# Patient Record
Sex: Female | Born: 1980
Health system: Southern US, Community
[De-identification: ages and names within clinical notes are randomized; demographics above are authoritative.]

## PROBLEM LIST (undated history)

## (undated) ENCOUNTER — Inpatient Hospital Stay (HOSPITAL_COMMUNITY): Payer: Self-pay

## (undated) ENCOUNTER — Emergency Department (HOSPITAL_COMMUNITY): Admission: EM | Payer: 59 | Source: Home / Self Care

## (undated) DIAGNOSIS — D649 Anemia, unspecified: Secondary | ICD-10-CM

## (undated) DIAGNOSIS — G5602 Carpal tunnel syndrome, left upper limb: Secondary | ICD-10-CM

## (undated) DIAGNOSIS — M6740 Ganglion, unspecified site: Secondary | ICD-10-CM

## (undated) DIAGNOSIS — A6000 Herpesviral infection of urogenital system, unspecified: Secondary | ICD-10-CM

## (undated) HISTORY — PX: WISDOM TOOTH EXTRACTION: SHX21

## (undated) HISTORY — DX: Anemia, unspecified: D64.9

---
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
## 2002-02-16 ENCOUNTER — Inpatient Hospital Stay (HOSPITAL_COMMUNITY): Admission: AD | Admit: 2002-02-16 | Discharge: 2002-02-16 | Payer: Self-pay | Admitting: Gynecology

## 2002-03-14 ENCOUNTER — Inpatient Hospital Stay (HOSPITAL_COMMUNITY): Admission: AD | Admit: 2002-03-14 | Discharge: 2002-03-14 | Payer: Self-pay | Admitting: Gynecology

## 2002-04-14 ENCOUNTER — Observation Stay (HOSPITAL_COMMUNITY): Admission: AD | Admit: 2002-04-14 | Discharge: 2002-04-15 | Payer: Self-pay | Admitting: *Deleted

## 2004-07-12 ENCOUNTER — Ambulatory Visit (HOSPITAL_COMMUNITY): Admission: RE | Admit: 2004-07-12 | Discharge: 2004-07-12 | Payer: Self-pay | Admitting: Family Medicine

## 2005-07-02 ENCOUNTER — Inpatient Hospital Stay (HOSPITAL_COMMUNITY): Admission: AD | Admit: 2005-07-02 | Discharge: 2005-07-02 | Payer: Self-pay | Admitting: Gynecology

## 2005-07-04 ENCOUNTER — Other Ambulatory Visit: Admission: RE | Admit: 2005-07-04 | Discharge: 2005-07-04 | Payer: Self-pay | Admitting: Gynecology

## 2005-08-18 ENCOUNTER — Inpatient Hospital Stay (HOSPITAL_COMMUNITY): Admission: AD | Admit: 2005-08-18 | Discharge: 2005-08-18 | Payer: Self-pay | Admitting: Gynecology

## 2005-12-25 ENCOUNTER — Inpatient Hospital Stay (HOSPITAL_COMMUNITY): Admission: AD | Admit: 2005-12-25 | Discharge: 2005-12-28 | Payer: Self-pay | Admitting: Gynecology

## 2006-02-11 ENCOUNTER — Other Ambulatory Visit: Admission: RE | Admit: 2006-02-11 | Discharge: 2006-02-11 | Payer: Self-pay | Admitting: Gynecology

## 2012-01-26 ENCOUNTER — Ambulatory Visit (INDEPENDENT_AMBULATORY_CARE_PROVIDER_SITE_OTHER): Payer: 59 | Admitting: Family Medicine

## 2012-01-26 VITALS — BP 113/76 | HR 62 | Temp 98.3°F | Resp 16 | Ht 68.0 in | Wt 292.0 lb

## 2012-01-26 DIAGNOSIS — J310 Chronic rhinitis: Secondary | ICD-10-CM

## 2012-01-26 DIAGNOSIS — J069 Acute upper respiratory infection, unspecified: Secondary | ICD-10-CM

## 2012-01-26 MED ORDER — FLUTICASONE PROPIONATE 50 MCG/ACT NA SUSP: 2.0000 | Freq: Every day | NASAL | Status: DC

## 2012-01-26 MED ORDER — HYDROCODONE-HOMATROPINE 5-1.5 MG/5ML PO SYRP: 5.0000 mL | ORAL_SOLUTION | Freq: Three times a day (TID) | ORAL | Status: AC | PRN

## 2012-01-26 MED ORDER — BENZONATATE 100 MG PO CAPS: 200.0000 mg | ORAL_CAPSULE | Freq: Two times a day (BID) | ORAL | Status: AC | PRN

## 2012-01-26 NOTE — Progress Notes (Signed)
  [facility]:  Office Visit  Chief Complaint:  Chief Complaint  Patient presents with  . Cough    HPI: Heather [name] is a 31 y.o. female who complains of  1 week h/o cough, white phlegm, worse at night, no fever, + chills. No msk aches or pain. + sore throat. Tried theraflu, hot drinks, tylenol cold and flu, D[place], tea with honey without relief  History reviewed. No pertinent past medical history. History reviewed. No pertinent past surgical history. History   Social History  . Marital Status: Single    Spouse Name: N/A    Number of Children: N/A  . Years of Education: N/A   Social History Main Topics  . Smoking status: Never Smoker   . Smokeless tobacco: None  . Alcohol Use: No  . Drug Use: No  . Sexually Active: None   Other Topics Concern  . None   Social History Narrative  . None   No family history on file. No Known Allergies Prior to Admission medications   Medication Sig Start Date End Date Taking? Authorizing Provider  fluconazole (DIFLUCAN) 150 MG tablet Take 150 mg by mouth.   Yes Historical Provider, MD  valACYclovir (VALTREX) 500 MG tablet Take 500 mg by mouth as needed.   Yes Historical Provider, MD     ROS: The patient denies fevers, night sweats, unintentional weight loss, chest pain, palpitations, wheezing, dyspnea on exertion, nausea, vomiting, abdominal pain, dysuria, hematuria, melena, numbness, weakness, or tingling. + cough  All other systems have been reviewed and were otherwise negative with the exception of those mentioned in the HPI and as above.    PHYSICAL EXAM: Filed Vitals:   01/26/12 1933  BP: 113/76  Pulse: 62  Temp: 98.3 F (36.8 C)  Resp: 16   Filed Vitals:   01/26/12 1933  Height: 5\' 8"  (1.727 m)  Weight: 292 lb (132.45 kg)   Body mass index is 44.40 kg/(m^2).  General: Alert, no acute distress, obese HEENT:  Normocephalic, atraumatic, oropharynx patent. TM nl, no sinus tenderness, no exudates,  + boggy nasal passages Cardiovascular:  Regular rate and rhythm, no rubs murmurs or gallops.  No Carotid bruits, radial pulse intact. No pedal edema.  Respiratory: Clear to auscultation bilaterally.  No wheezes, rales, or rhonchi.  No cyanosis, no use of accessory musculature GI: No organomegaly, abdomen is soft and non-tender, positive bowel sounds.  No masses. Skin: No rashes. Neurologic: Facial musculature symmetric. Psychiatric: Patient is appropriate throughout our interaction. Lymphatic: No cervical lymphadenopathy Musculoskeletal: Gait intact.   LABS: No results found for this or any previous visit.   EKG/XRAY:   Primary read interpreted by Dr. Conley Rolls at [facility].   ASSESSMENT/PLAN: Encounter Diagnoses  Name Primary?  . Viral URI with cough Yes  . Rhinitis    Sxs treatment only with flonase, hydromet, tessalon perles    LE, THAO PHUONG, DO 01/30/2012 9:19 AM

## 2012-03-13 ENCOUNTER — Emergency Department (HOSPITAL_BASED_OUTPATIENT_CLINIC_OR_DEPARTMENT_OTHER)
Admission: EM | Admit: 2012-03-13 | Discharge: 2012-03-14 | Disposition: A | Discharge status: Home / Self Care | Payer: 59 | Attending: Emergency Medicine | Admitting: Emergency Medicine

## 2012-03-13 ENCOUNTER — Encounter (HOSPITAL_BASED_OUTPATIENT_CLINIC_OR_DEPARTMENT_OTHER): Payer: Self-pay | Admitting: *Deleted

## 2012-03-13 DIAGNOSIS — M62838 Other muscle spasm: Secondary | ICD-10-CM

## 2012-03-13 DIAGNOSIS — M542 Cervicalgia: Secondary | ICD-10-CM | POA: Insufficient documentation

## 2012-03-13 DIAGNOSIS — G5600 Carpal tunnel syndrome, unspecified upper limb: Secondary | ICD-10-CM | POA: Insufficient documentation

## 2012-03-13 HISTORY — DX: Herpesviral infection of urogenital system, unspecified: A60.00

## 2012-03-13 NOTE — ED Notes (Signed)
Pt states that she began to have neck pain that radiates down left arm as well as some numbness and tingling in her hand

## 2012-03-14 MED ORDER — IBUPROFEN 800 MG PO TABS: 800.0000 mg | ORAL_TABLET | Freq: Three times a day (TID) | ORAL | Status: AC

## 2012-03-14 NOTE — Discharge Instructions (Signed)
Carpal Tunnel Syndrome The carpal tunnel is a narrow hollow area in the wrist. It is formed by the wrist bones and ligaments. Nerves, blood vessels, and tendons (cord like structures which attach muscle to bone) on the palm side (the side of your hand in the direction your fingers bend) of your hand pass through the carpal tunnel. Repeated wrist motion or certain diseases may cause swelling within the tunnel. (That is why these are called repetitive trauma (damage caused by over use) disorders. It is also a common problem in late pregnancy.) This swelling pinches the main nerve in the wrist (median nerve) and causes the painful condition called carpal tunnel syndrome. A feeling of "pins and needles" may be noticed in the fingers or hand; however, the entire arm may ache from this condition. Carpal tunnel syndrome may clear up by itself. Cortisone injections may help. Sometimes, an operation may be needed to free the pinched nerve. An electromyogram (a type of test) may be needed to confirm this diagnosis (learning what is wrong). This is a test which measures nerve conduction. The nerve conduction is usually slowed in a carpal tunnel syndrome. HOME CARE INSTRUCTIONS   If your caregiver prescribed medication to help reduce swelling, take as directed.   If you were given a splint to keep your wrist from bending, use it as instructed. It is important to wear the splint at night. Use the splint for as long as you have pain or numbness in your hand, arm or wrist. This may take 1 to 2 months.   If you have pain at night, it may help to rub or shake your hand, or elevate your hand above the level of your heart (the center of your chest).   It is important to give your wrist a rest by stopping the activities that are causing the problem. If your symptoms (problems) are work-related, you may need to talk to your employer about changing to a job that does not require using your wrist.   Only take over-the-counter  or prescription medicines for pain, discomfort, or fever as directed by your caregiver.   Following periods of extended use, particularly strenuous use, apply an ice pack wrapped in a towel to the anterior (palm) side of the affected wrist for 20 to 30 minutes. Repeat as needed three to four times per day. This will help reduce the swelling.   Follow all instructions for follow-up with your caregiver. This includes any orthopedic referrals, physical therapy, and rehabilitation. Any delay in obtaining necessary care could result in a delay or failure of your condition to heal.  SEEK IMMEDIATE MEDICAL CARE IF:   You are still having pain and numbness following a week of treatment.   You develop new, unexplained symptoms.   Your current symptoms are getting worse and are not helped or controlled with medications.  MAKE SURE YOU:   Understand these instructions.   Will watch your condition.   Will get help right away if you are not doing well or get worse.  Document Released: 10/24/2000 Document Revised: 10/16/2011 Document Reviewed: 09/12/2011 [employer] Patient Information 2012 [employer]. 

## 2012-03-14 NOTE — ED Provider Notes (Signed)
History   This chart was scribed for Gillian Kluever Smitty Cords, MD scribed by Magnus Sinning. The patient was seen in room MH05/MH05 seen at 00:13    CSN: [id number]  Arrival date & time 03/13/12  2311   First MD Initiated Contact with Patient 03/14/12 0012      Chief Complaint  Patient presents with  . Neck Pain  . Shoulder Pain    (Consider location/radiation/quality/duration/timing/severity/associated sxs/prior treatment) The history is provided by the patient. No language interpreter was used.   Heather [name] is a 31 y.o. female who presents to the Emergency Department complaining of constant moderate left lateral neck pain to left shoulder pain and down arm> she has had intermittent paresthesia of the first 2.5 fingers of the left hand especially upon wakening or after typing on her keyboard at work.   Explains she was lying down earlier this evening when she began having sxs. Reports that she sleeps on her left side. Denies any recent heavy lifting or new workout.  No f/c/r. No swelling of the arm. No redness or rashes.  Pain in the shoulder worse with movement in certain directions. Spasmodic in nature.  No CP, no SOB.  No weakness.  No f/c/r.  No trauma.   Past Medical History  Diagnosis Date  . Herpes genitalia     History reviewed. No pertinent past surgical history.  History reviewed. No pertinent family history.  History  Substance Use Topics  . Smoking status: Never Smoker   . Smokeless tobacco: Not on file  . Alcohol Use: No   Review of Systems  Constitutional: Negative for fever.  HENT: Negative for neck stiffness.   Respiratory: Negative for shortness of breath.   Cardiovascular: Negative for chest pain, palpitations and leg swelling.  Neurological: Negative for weakness.  All other systems reviewed and are negative.   10 Systems reviewed and are negative for acute change except as noted in the HPI. Allergies  Review of patient's allergies indicates no  known allergies.  Home Medications   Current Outpatient Rx  Name Route Sig Dispense Refill  . ECHINACEA PO CAPS Oral Take 1 capsule by mouth daily.    Marland Kitchen FLUCONAZOLE 150 MG PO TABS Oral Take 150 mg by mouth every 30 (thirty) days.     . ADULT MULTIVITAMIN W/MINERALS CH Oral Take 1 tablet by mouth daily.    Marland Kitchen VALACYCLOVIR HCL 500 MG PO TABS Oral Take 500 mg by mouth 2 (two) times daily as needed. For outbreak      BP 165/76  Pulse 102  Temp(Src) 98.4 F (36.9 C) (Oral)  Resp 16  SpO2 100%  LMP 03/06/2012  Physical Exam  Nursing note and vitals reviewed. Constitutional: She is oriented to person, place, and time. She appears well-developed and well-nourished. No distress.  HENT:  Head: Normocephalic and atraumatic.  Eyes: EOM are normal. Pupils are equal, round, and reactive to light.  Neck: Neck supple. No tracheal deviation present. No thyromegaly present.       No lymphadenopathy of the anterior nor posterior cervical chain.  No supraclavicular or axillary lymphadenopathy Trachea midline  Cardiovascular: Normal rate and regular rhythm.   Pulmonary/Chest: Effort normal and breath sounds normal. No respiratory distress. She has no wheezes. She has no rales. She exhibits no tenderness.  Abdominal: Soft. Bowel sounds are normal. She exhibits no distension. There is no tenderness. There is no rebound and no guarding.  Musculoskeletal: Normal range of motion. She exhibits no edema.  Left shoulder: She exhibits spasm.       Arms:      Normal radial pulses B.   5/5 strength in BUE, intact  reflexes  Neurological: She is alert and oriented to person, place, and time. She has normal strength. No sensory deficit.  Skin: Skin is warm and dry.       Cap refill < 2 seconds  Psychiatric: She has a normal mood and affect. Her behavior is normal.    ED Course  Procedures (including critical care time) DIAGNOSTIC STUDIES: Oxygen Saturation is 100% on room air, normal by my  interpretation.    COORDINATION OF CARE:  Labs Reviewed - No data to display No results found.   No diagnosis found.    MDM  Carpal tunnel of the left hand, splint applied.  With trapezius spasm.  Do not sleep on left side.  Posture and improved ergonomic discussed with patient.  NSAIDs and close follow up.  Patient verbalizes understanding and agrees to follow up    I personally performed the services described in this documentation, which was scribed in my presence. The recorded information has been reviewed and considered.     Siara Gorder Smitty Cords, MD 03/14/12 0300

## 2012-06-10 DIAGNOSIS — G5602 Carpal tunnel syndrome, left upper limb: Secondary | ICD-10-CM

## 2012-06-10 DIAGNOSIS — M6740 Ganglion, unspecified site: Secondary | ICD-10-CM

## 2012-06-10 HISTORY — DX: Ganglion, unspecified site: M67.40

## 2012-06-10 HISTORY — DX: Carpal tunnel syndrome, left upper limb: G56.02

## 2012-06-11 ENCOUNTER — Other Ambulatory Visit: Payer: Self-pay | Admitting: Orthopedic Surgery

## 2012-06-21 ENCOUNTER — Encounter (HOSPITAL_BASED_OUTPATIENT_CLINIC_OR_DEPARTMENT_OTHER): Payer: Self-pay | Admitting: *Deleted

## 2012-06-28 ENCOUNTER — Encounter (HOSPITAL_BASED_OUTPATIENT_CLINIC_OR_DEPARTMENT_OTHER): Payer: Self-pay | Admitting: *Deleted

## 2012-06-28 ENCOUNTER — Encounter (HOSPITAL_BASED_OUTPATIENT_CLINIC_OR_DEPARTMENT_OTHER): Payer: Self-pay | Admitting: Anesthesiology

## 2012-06-28 ENCOUNTER — Encounter (HOSPITAL_BASED_OUTPATIENT_CLINIC_OR_DEPARTMENT_OTHER): Payer: Self-pay | Admitting: Orthopedic Surgery

## 2012-06-28 ENCOUNTER — Encounter (HOSPITAL_BASED_OUTPATIENT_CLINIC_OR_DEPARTMENT_OTHER)
Admission: RE | Disposition: A | Discharge status: Home / Self Care | Payer: Self-pay | Source: Ambulatory Visit | Attending: Orthopedic Surgery

## 2012-06-28 ENCOUNTER — Ambulatory Visit (HOSPITAL_BASED_OUTPATIENT_CLINIC_OR_DEPARTMENT_OTHER): Payer: Worker's Compensation | Admitting: Anesthesiology

## 2012-06-28 ENCOUNTER — Ambulatory Visit (HOSPITAL_BASED_OUTPATIENT_CLINIC_OR_DEPARTMENT_OTHER)
Admission: RE | Admit: 2012-06-28 | Discharge: 2012-06-28 | Disposition: A | Discharge status: Home / Self Care | Payer: Worker's Compensation | Source: Ambulatory Visit | Attending: Orthopedic Surgery | Admitting: Orthopedic Surgery

## 2012-06-28 DIAGNOSIS — M6740 Ganglion, unspecified site: Secondary | ICD-10-CM | POA: Insufficient documentation

## 2012-06-28 DIAGNOSIS — G5600 Carpal tunnel syndrome, unspecified upper limb: Secondary | ICD-10-CM | POA: Insufficient documentation

## 2012-06-28 HISTORY — PX: CARPAL TUNNEL RELEASE: SHX101

## 2012-06-28 HISTORY — DX: Ganglion, unspecified site: M67.40

## 2012-06-28 HISTORY — DX: Carpal tunnel syndrome, left upper limb: G56.02

## 2012-06-28 SURGERY — CARPAL TUNNEL RELEASE
Anesthesia: General | Site: Wrist | Laterality: Left | Wound class: Clean

## 2012-06-28 MED ORDER — FENTANYL CITRATE 0.05 MG/ML IJ SOLN
25.0000 ug | INTRAMUSCULAR | Status: DC | PRN
  Administered 2012-06-28: 25 ug via INTRAVENOUS

## 2012-06-28 MED ORDER — BUPIVACAINE HCL (PF) 0.25 % IJ SOLN
INTRAMUSCULAR | Status: DC | PRN
  Administered 2012-06-28: 10 mL

## 2012-06-28 MED ORDER — OXYCODONE HCL 5 MG/5ML PO SOLN: 5.0000 mg | Freq: Once | ORAL | Status: DC | PRN

## 2012-06-28 MED ORDER — FENTANYL CITRATE 0.05 MG/ML IJ SOLN
INTRAMUSCULAR | Status: DC | PRN
  Administered 2012-06-28 (×3): 50 ug via INTRAVENOUS

## 2012-06-28 MED ORDER — OXYCODONE HCL 5 MG PO TABS: 5.0000 mg | ORAL_TABLET | Freq: Once | ORAL | Status: DC | PRN

## 2012-06-28 MED ORDER — HYDROCODONE-ACETAMINOPHEN 5-325 MG PO TABS: ORAL_TABLET | ORAL | Status: DC

## 2012-06-28 MED ORDER — ONDANSETRON HCL 4 MG/2ML IJ SOLN
INTRAMUSCULAR | Status: DC | PRN
  Administered 2012-06-28: 4 mg via INTRAVENOUS

## 2012-06-28 MED ORDER — LIDOCAINE HCL (CARDIAC) 20 MG/ML IV SOLN
INTRAVENOUS | Status: DC | PRN
  Administered 2012-06-28: 50 mg via INTRAVENOUS

## 2012-06-28 MED ORDER — SCOPOLAMINE 1 MG/3DAYS TD PT72
1.0000 | MEDICATED_PATCH | Freq: Once | TRANSDERMAL | Status: DC
  Administered 2012-06-28: 1.5 mg via TRANSDERMAL

## 2012-06-28 MED ORDER — MIDAZOLAM HCL 5 MG/5ML IJ SOLN
INTRAMUSCULAR | Status: DC | PRN
  Administered 2012-06-28: 2 mg via INTRAVENOUS

## 2012-06-28 MED ORDER — CEFAZOLIN SODIUM-DEXTROSE 2-3 GM-% IV SOLR
2.0000 g | Freq: Once | INTRAVENOUS | Status: AC
  Administered 2012-06-28: 3 g via INTRAVENOUS

## 2012-06-28 MED ORDER — CHLORHEXIDINE GLUCONATE 4 % EX LIQD: 60.0000 mL | Freq: Once | CUTANEOUS | Status: DC

## 2012-06-28 MED ORDER — DEXAMETHASONE SODIUM PHOSPHATE 4 MG/ML IJ SOLN
INTRAMUSCULAR | Status: DC | PRN
  Administered 2012-06-28: 10 mg via INTRAVENOUS

## 2012-06-28 MED ORDER — PROPOFOL 10 MG/ML IV EMUL
INTRAVENOUS | Status: DC | PRN
  Administered 2012-06-28: 50 mg via INTRAVENOUS
  Administered 2012-06-28: 250 mg via INTRAVENOUS

## 2012-06-28 MED ORDER — METOCLOPRAMIDE HCL 5 MG/ML IJ SOLN: 10.0000 mg | Freq: Once | INTRAMUSCULAR | Status: DC | PRN

## 2012-06-28 MED ORDER — LACTATED RINGERS IV SOLN
INTRAVENOUS | Status: DC
  Administered 2012-06-28 (×2): via INTRAVENOUS

## 2012-06-28 SURGICAL SUPPLY — 35 items
BANDAGE ELASTIC 3 VELCRO ST LF (GAUZE/BANDAGES/DRESSINGS) ×2 IMPLANT
BANDAGE GAUZE ELAST BULKY 4 IN (GAUZE/BANDAGES/DRESSINGS) ×2 IMPLANT
BLADE MINI RND TIP GREEN BEAV (BLADE) IMPLANT
BLADE SURG 15 STRL LF DISP TIS (BLADE) ×2 IMPLANT
BLADE SURG 15 STRL SS (BLADE) ×4
BNDG CMPR 9X4 STRL LF SNTH (GAUZE/BANDAGES/DRESSINGS) ×1
BNDG ESMARK 4X9 LF (GAUZE/BANDAGES/DRESSINGS) ×1 IMPLANT
CHLORAPREP W/TINT 26ML (MISCELLANEOUS) ×2 IMPLANT
CLOTH BEACON ORANGE TIMEOUT ST (SAFETY) ×2 IMPLANT
CORDS BIPOLAR (ELECTRODE) ×2 IMPLANT
COVER MAYO STAND STRL (DRAPES) ×2 IMPLANT
COVER TABLE BACK 60X90 (DRAPES) ×2 IMPLANT
CUFF TOURNIQUET SINGLE 18IN (TOURNIQUET CUFF) ×2 IMPLANT
DRAPE EXTREMITY T 121X128X90 (DRAPE) ×2 IMPLANT
DRAPE SURG 17X23 STRL (DRAPES) ×2 IMPLANT
DRSG PAD ABDOMINAL 8X10 ST (GAUZE/BANDAGES/DRESSINGS) ×2 IMPLANT
GAUZE XEROFORM 1X8 LF (GAUZE/BANDAGES/DRESSINGS) ×2 IMPLANT
GLOVE BIO SURGEON STRL SZ 6.5 (GLOVE) ×1 IMPLANT
GLOVE BIO SURGEON STRL SZ7.5 (GLOVE) ×2 IMPLANT
GOWN PREVENTION PLUS XLARGE (GOWN DISPOSABLE) ×2 IMPLANT
GOWN STRL REIN XL XLG (GOWN DISPOSABLE) ×2 IMPLANT
NDL HYPO 25X1 1.5 SAFETY (NEEDLE) IMPLANT
NEEDLE HYPO 25X1 1.5 SAFETY (NEEDLE) ×2 IMPLANT
NS IRRIG 1000ML POUR BTL (IV SOLUTION) ×2 IMPLANT
PACK BASIN DAY SURGERY FS (CUSTOM PROCEDURE TRAY) ×2 IMPLANT
PADDING CAST ABS 4INX4YD NS (CAST SUPPLIES) ×1
PADDING CAST ABS COTTON 4X4 ST (CAST SUPPLIES) ×1 IMPLANT
SPONGE GAUZE 4X4 12PLY (GAUZE/BANDAGES/DRESSINGS) ×2 IMPLANT
STOCKINETTE 4X48 STRL (DRAPES) ×2 IMPLANT
SUT ETHILON 4 0 PS 2 18 (SUTURE) ×2 IMPLANT
SYR BULB 3OZ (MISCELLANEOUS) ×2 IMPLANT
SYR CONTROL 10ML LL (SYRINGE) ×1 IMPLANT
TOWEL OR 17X24 6PK STRL BLUE (TOWEL DISPOSABLE) ×4 IMPLANT
UNDERPAD 30X30 INCONTINENT (UNDERPADS AND DIAPERS) ×2 IMPLANT
WATER STERILE IRR 1000ML POUR (IV SOLUTION) ×2 IMPLANT

## 2012-06-28 NOTE — Transfer of Care (Signed)
Immediate Anesthesia Transfer of Care Note  Patient: Heather [name]  Procedure(s) Performed: Procedure(s) (LRB): CARPAL TUNNEL RELEASE (Left)  Patient Location: PACU  Anesthesia Type: General  Level of Consciousness: awake  Airway & Oxygen Therapy: Patient Spontanous Breathing and Patient connected to face mask oxygen  Post-op Assessment: Report given to PACU RN and Post -op Vital signs reviewed and stable  Post vital signs: Reviewed and stable  Complications: No apparent anesthesia complications

## 2012-06-28 NOTE — Op Note (Signed)
Dictation [id number]

## 2012-06-28 NOTE — Anesthesia Preprocedure Evaluation (Signed)
Anesthesia Evaluation  Patient identified by MRN, date of birth, ID band Patient awake    Reviewed: Allergy & Precautions, H&P , NPO status , Patient's Chart, lab work & pertinent test results, reviewed documented beta blocker date and time   Airway Mallampati: II TM Distance: >3 FB Neck ROM: full    Dental   Pulmonary neg pulmonary ROS,  breath sounds clear to auscultation        Cardiovascular negative cardio ROS  Rhythm:regular     Neuro/Psych  Neuromuscular disease negative psych ROS   GI/Hepatic negative GI ROS, Neg liver ROS,   Endo/Other  Morbid obesity  Renal/GU negative Renal ROS  negative genitourinary   Musculoskeletal   Abdominal   Peds  Hematology negative hematology ROS (+)   Anesthesia Other Findings See surgeon's H&P   Reproductive/Obstetrics negative OB ROS                           Anesthesia Physical Anesthesia Plan  ASA: III  Anesthesia Plan: General   Post-op Pain Management:    Induction: Intravenous  Airway Management Planned: LMA  Additional Equipment:   Intra-op Plan:   Post-operative Plan: Extubation in OR  Informed Consent: I have reviewed the patients History and Physical, chart, labs and discussed the procedure including the risks, benefits and alternatives for the proposed anesthesia with the patient or authorized representative who has indicated his/her understanding and acceptance.   Dental Advisory Given  Plan Discussed with: CRNA and Surgeon  Anesthesia Plan Comments:         Anesthesia Quick Evaluation

## 2012-06-28 NOTE — Anesthesia Postprocedure Evaluation (Signed)
  Anesthesia Post-op Note  Patient: Heather [name]  Procedure(s) Performed: Procedure(s) (LRB): CARPAL TUNNEL RELEASE (Left)  Patient Location: PACU  Anesthesia Type: General  Level of Consciousness: awake  Airway and Oxygen Therapy: Patient Spontanous Breathing and Patient connected to face mask oxygen  Post-op Pain: mild  Post-op Assessment: Post-op Vital signs reviewed, Patient's Cardiovascular Status Stable, Respiratory Function Stable, Patent Airway and No signs of Nausea or vomiting  Post-op Vital Signs: Reviewed and stable  Complications: No apparent anesthesia complications

## 2012-06-28 NOTE — H&P (Signed)
  Heather [name] is an 31 y.o. female.   Chief Complaint: left carpal tunnel syndrome and mass HPI: 31 yo rhd female with ~1 years of pins and needles sensation bilateral hands.  Nocturnal symptoms 4x/week.  Also notes mass on volar aspect of left wrist that is bothersome to her.  Positive nerve conduction studies.  Past Medical History  Diagnosis Date  . Herpes genitalia   . Carpal tunnel syndrome, left 06/2012  . Ganglion cyst 06/2012    left volar    Past Surgical History  Procedure Date  . Wisdom tooth extraction     History reviewed. No pertinent family history. Social History:  reports that she has never smoked. She has never used smokeless tobacco. She reports that she does not drink alcohol or use illicit drugs.  Allergies: No Known Allergies  Medications Prior to Admission  Medication Sig Dispense Refill  . Multiple Vitamin (MULITIVITAMIN WITH MINERALS) TABS Take 1 tablet by mouth daily.      . valACYclovir (VALTREX) 500 MG tablet Take 500 mg by mouth 2 (two) times daily as needed. For outbreak        No results found for this or any previous visit (from the past 48 hour(s)).  No results found.   A comprehensive review of systems was negative except for: Eyes: positive for contacts/glasses  Blood pressure 146/79, pulse 77, temperature 98.1 F (36.7 C), temperature source Oral, resp. rate 16, height 5\' 8"  (1.727 m), weight 136.079 kg (300 lb), last menstrual period 06/16/2012, SpO2 100.00%.  General appearance: alert, cooperative and appears stated age Head: Normocephalic, without obvious abnormality, atraumatic Neck: supple, symmetrical, trachea midline Resp: clear to auscultation bilaterally Cardio: regular rate and rhythm GI: soft, non-tender; bowel sounds normal; no masses,  no organomegaly Extremities: light touch sensation and capillary refill intact all digits.  +epl/fpl/io.  skin intact.  mass on volar radial side of wrist.  no skin changes.   transilluminates. Pulses: 2+ and symmetric Skin: Skin color, texture, turgor normal. No rashes or lesions Neurologic: Grossly normal Incision/Wound: na  Assessment/Plan Left carpal tunnel syndrome and ganglion cyst.  Discussed with patient the nature of the conditions.  Discussed non operative and operative treatment options.  She wishes to proceed with left carpal tunnel release and excision mass.  Risks, benefits, and alternatives of surgery were discussed and the patient agrees with the plan of care.   Lien Lyman R 06/28/2012, 8:43 AM

## 2012-06-28 NOTE — Anesthesia Postprocedure Evaluation (Signed)
Anesthesia Post Note  Patient: Heather [name]  Procedure(s) Performed: Procedure(s) (LRB): CARPAL TUNNEL RELEASE (Left)  Anesthesia type: General  Patient location: PACU  Post pain: Pain level controlled  Post assessment: Patient's Cardiovascular Status Stable  Last Vitals:  Filed Vitals:   06/28/12 1115  BP: 114/61  Pulse: 68  Temp:   Resp: 18    Post vital signs: Reviewed and stable  Level of consciousness: alert  Complications: No apparent anesthesia complications

## 2012-06-29 ENCOUNTER — Encounter (HOSPITAL_BASED_OUTPATIENT_CLINIC_OR_DEPARTMENT_OTHER): Payer: Self-pay | Admitting: Orthopedic Surgery

## 2012-06-29 NOTE — Op Note (Signed)
Heather [name], Heather [name]          ACCOUNT NO.:  [id number]  MEDICAL RECORD NO.:  [id number]  LOCATION:                                 FACILITY:  PHYSICIAN:  Betha Loa, MD        DATE OF BIRTH:  August 06, 1981  DATE OF PROCEDURE:  06/28/2012 DATE OF DISCHARGE:                              OPERATIVE REPORT   PREOPERATIVE DIAGNOSES:  Left carpal tunnel syndrome and left wrist volar ganglion cyst.  POSTOPERATIVE DIAGNOSES:  Left carpal tunnel syndrome and left wrist volar ganglion cyst.  PROCEDURE:  Left carpal tunnel release and excision of mass volar left wrist.  SURGEON:  Betha Loa, MD  ASSISTANT:  None.  ANESTHESIA:  General.  IV FLUIDS:  Per anesthesia flow sheet.  ESTIMATED BLOOD LOSS:  Minimal.  COMPLICATIONS:  None.  SPECIMENS:  Left wrist mass to Pathology.  TOURNIQUET TIME:  36 minutes.  DISPOSITION:  Stable to PACU.  INDICATIONS:  Ms. Heather [name] is a 31 year old right-hand-dominant female who has had pins and needle sensation in bilateral hands for approximately 1 year.  She followed up with me in the office.  She had positive physical exam findings and positive nerve conduction studies for carpal tunnel syndrome bilaterally.  She had nocturnal symptoms that wake her up four nights per week.  We discussed nonoperative and operative treatment options for carpal tunnel syndrome and she wished to proceed with operative carpal tunnel release.  She also noted a mass on the volar aspect of the left wrist.  This was ballotable, mobile under the skin and strongly transilluminates.  She wished to have this excised as well.  Risks, benefits and alternatives of the surgery were discussed including the risk of blood loss; infection; damage to nerves, vessels, tendons, ligaments, bone; failure of surgery; need for additional surgery; complications with wound healing; continued pain; recurrence of carpal tunnel syndrome; and recurrence of the cyst.  She  voiced understanding of these risks and elected to proceed.  OPERATIVE COURSE:  After being identified preoperatively by myself, the patient and I agreed upon the procedure and site of procedure.  The surgical site was marked.  The risks, benefits, and alternatives of the surgery were reviewed and she wished to proceed.  Surgical consent had been signed.  She was given 2 g of IV Ancef as preoperative antibiotic prophylaxis.  She was transported to the operating room and placed on the operating room table in supine position with the left upper extremity on an armboard.  General anesthesia was induced by the anesthesiologist.  The left upper extremity was prepped and draped in normal sterile orthopedic fashion.  A surgical pause was performed between the surgeons, anesthesia, and operating room staff, and all were in agreement as to the patient, procedure, and site of procedure. Tourniquet at the proximal aspect of the extremity was inflated to 250 mmHg after exsanguination of the limb with an Esmarch bandage.  The carpal tunnel was addressed first.  An incision was made over the transverse carpal ligament and carried into subcutaneous tissues by spreading technique.  Bipolar electrocautery was used to obtain hemostasis.  The transverse carpal ligament was incised in its distal direction.  Care was taken to ensure  complete decompression.  It was then incised proximally.  The distal aspect of the volar antebrachial fascia was split using the scissors.  A finger was placed into the wound and there was noted to be good decompression of the nerve.  The nerve was inspected.  It was mildly flattened.  The motor branch was identified and was intact.  The wound was copiously irrigated with sterile saline.  The wound was then closed with 4-0 nylon in a horizontal mattress fashion.  Attention was turned to the volar wrist ganglion.  A Brunner-type incision was made at the radial side of the volar  wrist.  This was carried into the subcutaneous tissues by spreading technique.  Again, bipolar electrocautery was used to obtain hemostasis.  The mass was easily identified.  It was cleared of soft tissue attachments.  The radial artery was stuck to the mass on the radial side.  It was freed up easily.  The stalk of the mass was able to be traced down to the radiocarpal joint.  The stalk was tied off and the mass excised and sent to Pathology for examination.  The stalk was then rongeured and bipolared.  The wound was copiously irrigated with sterile saline.  Tourniquet was deflated at 36 minutes.  Bipolar electrocautery was used to obtain hemostasis.  The wound was then closed with a single inverted interrupted Vicryl suture in the subcutaneous tissues and 4-0 nylon in the skin in a horizontal mattress fashion.  The wounds were then injected with 10 mL of 0.25% plain Marcaine to aid in postoperative analgesia.  They were dressed with sterile Xeroform, 4x4s, and ABD, and wrapped with Kerlix and Ace bandage.  Operative drapes were broken down and the patient was awakened from anesthesia safely.  She was transferred back to stretcher and taken to PACU in stable condition. All fingertips were pink.  I will see her back in the office in 1 week for postoperative followup.  I will give her Norco 5/325 1-2 p.o. q.6 hours p.r.n. pain, dispensed #40.     Betha Loa, MD     KK/MEDQ  D:  06/28/2012  T:  06/29/2012  Job:  [id number]

## 2015-07-14 ENCOUNTER — Emergency Department (INDEPENDENT_AMBULATORY_CARE_PROVIDER_SITE_OTHER): Admission: EM | Admit: 2015-07-14 | Discharge: 2015-07-14 | Payer: 59 | Source: Home / Self Care

## 2015-07-14 ENCOUNTER — Emergency Department (HOSPITAL_COMMUNITY)
Admission: EM | Admit: 2015-07-14 | Discharge: 2015-07-14 | Disposition: A | Discharge status: Home / Self Care | Payer: 59 | Attending: Emergency Medicine | Admitting: Emergency Medicine

## 2015-07-14 ENCOUNTER — Encounter (HOSPITAL_COMMUNITY): Payer: Self-pay | Admitting: Emergency Medicine

## 2015-07-14 ENCOUNTER — Emergency Department (HOSPITAL_COMMUNITY): Payer: 59

## 2015-07-14 DIAGNOSIS — Z8619 Personal history of other infectious and parasitic diseases: Secondary | ICD-10-CM | POA: Insufficient documentation

## 2015-07-14 DIAGNOSIS — R208 Other disturbances of skin sensation: Secondary | ICD-10-CM

## 2015-07-14 DIAGNOSIS — Z8669 Personal history of other diseases of the nervous system and sense organs: Secondary | ICD-10-CM | POA: Insufficient documentation

## 2015-07-14 DIAGNOSIS — Z8739 Personal history of other diseases of the musculoskeletal system and connective tissue: Secondary | ICD-10-CM | POA: Diagnosis not present

## 2015-07-14 DIAGNOSIS — R200 Anesthesia of skin: Secondary | ICD-10-CM

## 2015-07-14 DIAGNOSIS — R531 Weakness: Secondary | ICD-10-CM | POA: Diagnosis present

## 2015-07-14 MED ORDER — LORAZEPAM 2 MG/ML IJ SOLN
2.0000 mg | Freq: Once | INTRAMUSCULAR | Status: AC
  Administered 2015-07-14: 2 mg via INTRAMUSCULAR
  Filled 2015-07-14: qty 1

## 2015-07-14 NOTE — ED Notes (Signed)
Patient here with complaint of right hand numbness and weakness. Palm of hand in finger tips are numb. Patient is unable to grasp, but able to open hand and spread fingers. Reports history of carpal tunnel release performed on the left hand.

## 2015-07-14 NOTE — ED Notes (Signed)
The patient presented to the [facility] with a complaint of numbness to the right hand and forearm that started approximately an hour ago. 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 She did report a hx of carpal tunnel syndrome in the left hand.

## 2015-07-14 NOTE — Discharge Instructions (Signed)
Paresthesia Follow up with [facility]. Return for any facial droop, slurred speech, confusion, or increased weakness or numbness. Paresthesia is a burning or prickling feeling. This feeling can happen in any part of the body. It often happens in the hands, arms, legs, or feet. HOME CARE  Avoid drinking alcohol.  Try massage or needle therapy (acupuncture) to help with your problems.  Keep all doctor visits as told. GET HELP RIGHT AWAY IF:   You feel weak.  You have trouble walking or moving.  You have problems speaking or seeing.  You feel confused.  You cannot control when you poop (bowel movement) or pee (urinate).  You lose feeling (numbness) after an injury.  You pass out (faint).  Your burning or prickling feeling gets worse when you walk.  You have pain, cramps, or feel dizzy.  You have a rash. MAKE SURE YOU:   Understand these instructions.  Will watch your condition.  Will get help right away if you are not doing well or get worse. Document Released: 10/09/2008 Document Revised: 01/19/2012 Document Reviewed: 07/18/2011 [facility] Patient Information 2015 [place], [place]. This information is not intended to replace advice given to you by your health care provider. Make sure you discuss any questions you have with your health care provider.

## 2015-07-14 NOTE — ED Notes (Signed)
Pt st's onset of approx 2 hours ago she can not grasp anything with her right hand.  Strong radial pulse present.

## 2015-07-14 NOTE — ED Provider Notes (Signed)
CSN: [id number]     Arrival date & time 07/14/15  2008 History   First MD Initiated Contact with Patient 07/14/15 2028     Chief Complaint  Patient presents with  . Hand Problem     (Consider location/radiation/quality/duration/timing/severity/associated sxs/prior Treatment) The history is provided by the patient. No language interpreter was used.  Ms. Heather [name] is a 34 year old female with a history of carpal tunnel syndrome on the left and left ganglion cyst who presents for right hand numbness and weakness that began 2 hours ago. She states she has loss of grip strength. Her symptoms have not gotten any worse. She was evaluated at urgent care prior to arrival and was sent to the ED for further evaluation. Urgent care physician discussed this patient with Dr. Roseanne Reno, from neurology who agreed that the patient would need an MRI brain. She denies any confusion, dizziness, headache, vision changes, facial droop, slurred speech, neck pain, chest pain, shortness of breath, weakness in her lower extremities, abnormal gait.  Past Medical History  Diagnosis Date  . Herpes genitalia   . Carpal tunnel syndrome, left 06/2012  . Ganglion cyst 06/2012    left volar   Past Surgical History  Procedure Laterality Date  . Wisdom tooth extraction    . Carpal tunnel release  06/28/2012    Procedure: CARPAL TUNNEL RELEASE;  Surgeon: Tami Ribas, MD;  Location: [facility];  Service: Orthopedics;  Laterality: Left;  EXCISION VOLAR GANGLION CYST and carpal tunnel release left timeouts for ganglion cyst done at same time as timeouts for carpal tunnel with same staffs   History reviewed. No pertinent family history. Social History  Substance Use Topics  . Smoking status: Never Smoker   . Smokeless tobacco: Never Used  . Alcohol Use: No   OB History    No data available     Review of Systems  Cardiovascular: Negative for chest pain.  Musculoskeletal: Negative for joint swelling and neck  pain.  Neurological: Positive for weakness. Negative for dizziness, syncope, numbness and headaches.  All other systems reviewed and are negative.     Allergies  Review of patient's allergies indicates no known allergies.  Home Medications   Prior to Admission medications   Medication Sig Start Date End Date Taking? Authorizing Provider  Multiple Vitamin (MULITIVITAMIN WITH MINERALS) TABS Take 1 tablet by mouth daily.    Historical Provider, MD  valACYclovir (VALTREX) 500 MG tablet Take 500 mg by mouth 2 (two) times daily as needed. For outbreak    Historical Provider, MD   BP 138/99 mmHg  Pulse 60  Temp(Src) 98.1 F (36.7 C) (Oral)  Resp 18  Ht 5\' 8"  (1.727 m)  Wt 266 lb 7 oz (120.855 kg)  BMI 40.52 kg/m2  SpO2 100%  LMP 07/14/2015 (Exact Date) Physical Exam  Constitutional: She is oriented to person, place, and time. She appears well-developed and well-nourished.  HENT:  Head: Normocephalic and atraumatic.  Eyes: Conjunctivae are normal.  Neck: Normal range of motion. Neck supple.  Cardiovascular: Normal rate, regular rhythm and normal heart sounds.   Pulmonary/Chest: Effort normal and breath sounds normal.  Abdominal: There is no tenderness.  Musculoskeletal: Normal range of motion.  Neurological: She is alert and oriented to person, place, and time. GCS eye subscore is 4. GCS verbal subscore is 5. GCS motor subscore is 6.  Right hand: Numbness to the hyperthenar area of the palmar surface of the hand. 1/5 grip strength compared to 5/5 grip strength  with the left hand. Able to flex and extend the wrist without difficulty. Less than 2 second capillary refill. 2+ radial pulse. Good sensation along the thenar, base of fingers, and all fingers.  Cranial nerves III through XII intact.  Skin: Skin is warm and dry.  Psychiatric: She has a normal mood and affect. Her behavior is normal.  Nursing note and vitals reviewed.   ED Course  Procedures (including critical care  time) Labs Review Labs Reviewed - No data to display  Imaging Review Mr Brain Wo Contrast  07/14/2015   CLINICAL DATA:  RIGHT hand and wrist numbness beginning 1 hour ago. History of carpal tunnel syndrome.  EXAM: MRI HEAD WITHOUT CONTRAST  TECHNIQUE: Multiplanar, multiecho pulse sequences of the brain and surrounding structures were obtained without intravenous contrast.  COMPARISON:  None.  FINDINGS: The ventricles and sulci are normal for patient's age. No abnormal parenchymal signal, mass lesions, mass effect. No reduced diffusion to suggest acute ischemia. No susceptibility artifact to suggest hemorrhage. Suspected dural calcifications along the falx.  No abnormal extra-axial fluid collections. No extra-axial masses though, contrast enhanced sequences would be more sensitive. Normal major intracranial vascular flow voids seen at the skull base.  Ocular globes and orbital contents are unremarkable though not tailored for evaluation. No abnormal sellar expansion. Visualized paranasal sinuses and mastoid air cells are well-aerated. No suspicious calvarial bone marrow signal. No abnormal sellar expansion. Craniocervical junction maintained.  IMPRESSION: Normal noncontrast MRI of the brain.   Electronically Signed   By: Awilda Metro M.D.   On: 07/14/2015 22:21   I have personally reviewed and evaluated these image results as part of my medical decision-making.   EKG Interpretation None      MDM  Patient presents for right palmar surface of the hand weakness and numbness 2 hours. She has no other neurological deficits or findings. Dr. Lu Duffel from neurology was consult did by urgent care and recommended MR brain.  Final diagnoses:  Hand numbness   MR brain is negative for any mass or hemorrhage that may be causing her symptoms. I discussed following up with [place] [facility] for further evaluation. I also gave the patient return precautions such as facial droop, slurring speech,  confusion, increased weakness or numbness in the hands or arm. Patient verbally agrees with the plan.   Catha Gosselin, PA-C 07/14/15 2242  Bethann Berkshire, MD 07/14/15 [phone]

## 2015-07-14 NOTE — ED Provider Notes (Signed)
CSN: [id number]     Arrival date & time 07/14/15  1920 History   None    Chief Complaint  Patient presents with  . Numbness   (Consider location/radiation/quality/duration/timing/severity/associated sxs/prior Treatment) HPI  R hand and wrist numbness. Started 1 hour ago. Unable to grip. Associated w/ shooting pain from elbow to wrist. Has not taken anything for the symptoms.. Symptoms are constant and not getting better or worse. Acute onset. Patient states that she has slight return of some of her sensation but is unable to flex her fingers and has minimal flexion of her wrist. Denies any facial droop, slurred speech, change in cognition, neck stiffness, chest pain, palpitations, headache, and stability in her gait.    Past Medical History  Diagnosis Date  . Herpes genitalia   . Carpal tunnel syndrome, left 06/2012  . Ganglion cyst 06/2012    left volar   Past Surgical History  Procedure Laterality Date  . Wisdom tooth extraction    . Carpal tunnel release  06/28/2012    Procedure: CARPAL TUNNEL RELEASE;  Surgeon: Tami Ribas, MD;  Location: [facility];  Service: Orthopedics;  Laterality: Left;  EXCISION VOLAR GANGLION CYST and carpal tunnel release left timeouts for ganglion cyst done at same time as timeouts for carpal tunnel with same staffs   History reviewed. No pertinent family history. Social History  Substance Use Topics  . Smoking status: Never Smoker   . Smokeless tobacco: Never Used  . Alcohol Use: No   OB History    No data available     Review of Systems Per HPI with all other pertinent systems negative.   Allergies  Review of patient's allergies indicates no known allergies.  Home Medications   Prior to Admission medications   Medication Sig Start Date End Date Taking? Authorizing Provider  Multiple Vitamin (MULITIVITAMIN WITH MINERALS) TABS Take 1 tablet by mouth daily.    Historical Provider, MD  valACYclovir (VALTREX) 500 MG tablet Take  500 mg by mouth 2 (two) times daily as needed. For outbreak    Historical Provider, MD   Meds Ordered and Administered this Visit  Medications - No data to display  BP 100/69 mmHg  Pulse 58  Temp(Src) 98.5 F (36.9 C) (Oral)  Resp 18  SpO2 100%  LMP 07/14/2015 No data found.   Physical Exam Physical Exam  Constitutional: oriented to person, place, and time. appears well-developed and well-nourished. No distress.  HENT:  Head: Normocephalic and atraumatic.  Eyes: EOMI. PERRL.  Neck: Normal range of motion.  Cardiovascular: RRR, no m/r/g, 2+ distal pulses,  Pulmonary/Chest: Effort normal and breath sounds normal. No respiratory distress.  Abdominal: Soft. Bowel sounds are normal. NonTTP, no distension.  Musculoskeletal: Normal range of motion. Non ttp, no effusion.  Neurological: Cranial nerves II through XII intact, normal gait, right hand with 1-2 out of 5 finger flexion and grip strength and 2 out of 5 flexion of the wrist. Skin: Skin is warm. No rash noted. non diaphoretic.  Psychiatric: normal mood and affect. behavior is normal. Judgment and thought content normal.   ED Course  Procedures (including critical care time)  Labs Review Labs Reviewed - No data to display  Imaging Review No results found.   Visual Acuity Review  Right Eye Distance:   Left Eye Distance:   Bilateral Distance:    Right Eye Near:   Left Eye Near:    Bilateral Near:         MDM  1. Hand numbness    Etiology not immediately clear. Discussed case with Dr. Roseanne Reno of neurology who agrees with sending patient to the ED for further workup including MRI. Discussed this with patient and will transport patient via shuttle.    Ozella Rocks, MD 07/14/15 [phone]

## 2015-07-14 NOTE — ED Notes (Signed)
Patient left at this time with all belongings. 

## 2015-08-16 DIAGNOSIS — R7309 Other abnormal glucose: Secondary | ICD-10-CM | POA: Insufficient documentation

## 2015-11-11 NOTE — L&D Delivery Note (Signed)
Delivery Note  First Stage: Labor onset: 1000 Augmentation : none Analgesia /Anesthesia intrapartum: epidural AROM at 0931  Second Stage: Complete dilation at 1105 Onset of pushing at 1106 FHR second stage 135  Delivery of a viable female at 1110 by CNM in OA position No nuchal cord Cord double clamped after cessation of pulsation, cut by FOB Cord blood sample collected    Third Stage: Placenta delivered via Tomasa BlaseSchultz intact with 3 VC @ 1113 Placenta disposition: L&D Uterine tone firm / bleeding scant  No laceration identified  Est. Blood Loss (mL): 25  Complications: none  Mom to postpartum.  Baby to Couplet care / Skin to Skin.  Newborn: Birth Weight: 6 lbs 7.2 oz  Apgar Scores: 8/9 Feeding planned: breast  Raelyn MoraAWSON, Sulaiman Imbert, M  MSN, CNM 09/15/2016, 11:35 AM

## 2016-02-28 LAB — OB RESULTS CONSOLE GC/CHLAMYDIA
Chlamydia: NEGATIVE
GC PROBE AMP, GENITAL: NEGATIVE

## 2016-02-28 LAB — OB RESULTS CONSOLE RPR: RPR: NONREACTIVE

## 2016-02-28 LAB — OB RESULTS CONSOLE HIV ANTIBODY (ROUTINE TESTING): HIV: NONREACTIVE

## 2016-02-28 LAB — OB RESULTS CONSOLE RUBELLA ANTIBODY, IGM: RUBELLA: IMMUNE

## 2016-02-28 LAB — OB RESULTS CONSOLE HEPATITIS B SURFACE ANTIGEN: HEP B S AG: NEGATIVE

## 2016-04-30 ENCOUNTER — Other Ambulatory Visit (HOSPITAL_COMMUNITY): Payer: Self-pay | Admitting: Obstetrics & Gynecology

## 2016-04-30 DIAGNOSIS — Z3A21 21 weeks gestation of pregnancy: Secondary | ICD-10-CM

## 2016-04-30 DIAGNOSIS — Z3689 Encounter for other specified antenatal screening: Secondary | ICD-10-CM

## 2016-05-09 ENCOUNTER — Other Ambulatory Visit (HOSPITAL_COMMUNITY): Payer: Self-pay | Admitting: Obstetrics & Gynecology

## 2016-05-09 ENCOUNTER — Other Ambulatory Visit (HOSPITAL_COMMUNITY): Payer: Self-pay

## 2016-05-09 ENCOUNTER — Ambulatory Visit (HOSPITAL_COMMUNITY)
Admission: RE | Admit: 2016-05-09 | Discharge: 2016-05-09 | Disposition: A | Discharge status: Home / Self Care | Payer: BLUE CROSS/BLUE SHIELD | Source: Ambulatory Visit | Attending: Obstetrics & Gynecology | Admitting: Obstetrics & Gynecology

## 2016-05-09 DIAGNOSIS — O99212 Obesity complicating pregnancy, second trimester: Secondary | ICD-10-CM | POA: Insufficient documentation

## 2016-05-09 DIAGNOSIS — Z36 Encounter for antenatal screening of mother: Secondary | ICD-10-CM | POA: Diagnosis not present

## 2016-05-09 DIAGNOSIS — Z3A21 21 weeks gestation of pregnancy: Secondary | ICD-10-CM

## 2016-05-09 DIAGNOSIS — Z3689 Encounter for other specified antenatal screening: Secondary | ICD-10-CM

## 2016-07-02 ENCOUNTER — Inpatient Hospital Stay (HOSPITAL_COMMUNITY)
Admission: AD | Admit: 2016-07-02 | Discharge: 2016-07-02 | Disposition: A | Discharge status: Home / Self Care | Payer: BLUE CROSS/BLUE SHIELD | Source: Ambulatory Visit | Attending: Obstetrics & Gynecology | Admitting: Obstetrics & Gynecology

## 2016-07-02 ENCOUNTER — Other Ambulatory Visit (HOSPITAL_COMMUNITY): Payer: Self-pay | Admitting: Obstetrics & Gynecology

## 2016-07-02 ENCOUNTER — Encounter (HOSPITAL_COMMUNITY): Payer: Self-pay | Admitting: *Deleted

## 2016-07-02 DIAGNOSIS — G5600 Carpal tunnel syndrome, unspecified upper limb: Secondary | ICD-10-CM | POA: Diagnosis not present

## 2016-07-02 DIAGNOSIS — O99353 Diseases of the nervous system complicating pregnancy, third trimester: Secondary | ICD-10-CM | POA: Diagnosis not present

## 2016-07-02 DIAGNOSIS — O26893 Other specified pregnancy related conditions, third trimester: Secondary | ICD-10-CM | POA: Diagnosis not present

## 2016-07-02 DIAGNOSIS — Z3A28 28 weeks gestation of pregnancy: Secondary | ICD-10-CM | POA: Insufficient documentation

## 2016-07-02 DIAGNOSIS — O219 Vomiting of pregnancy, unspecified: Secondary | ICD-10-CM

## 2016-07-02 DIAGNOSIS — O26843 Uterine size-date discrepancy, third trimester: Secondary | ICD-10-CM

## 2016-07-02 DIAGNOSIS — K529 Noninfective gastroenteritis and colitis, unspecified: Secondary | ICD-10-CM

## 2016-07-02 DIAGNOSIS — R1110 Vomiting, unspecified: Secondary | ICD-10-CM

## 2016-07-02 LAB — URINE MICROSCOPIC-ADD ON

## 2016-07-02 LAB — URINALYSIS, ROUTINE W REFLEX MICROSCOPIC
Glucose, UA: NEGATIVE mg/dL
Hgb urine dipstick: NEGATIVE
KETONES UR: 15 mg/dL — AB
NITRITE: NEGATIVE
Protein, ur: 30 mg/dL — AB
SPECIFIC GRAVITY, URINE: 1.015 (ref 1.005–1.030)
pH: 7.5 (ref 5.0–8.0)

## 2016-07-02 MED ORDER — PROMETHAZINE HCL 25 MG PO TABS: 25.0000 mg | ORAL_TABLET | Freq: Four times a day (QID) | ORAL | 0 refills | Status: DC | PRN

## 2016-07-02 MED ORDER — ONDANSETRON HCL 4 MG/2ML IJ SOLN
4.0000 mg | Freq: Once | INTRAMUSCULAR | Status: AC
  Administered 2016-07-02: 4 mg via INTRAVENOUS
  Filled 2016-07-02: qty 2

## 2016-07-02 MED ORDER — LACTATED RINGERS IV BOLUS (SEPSIS)
1000.0000 mL | Freq: Once | INTRAVENOUS | Status: AC
  Administered 2016-07-02: 1000 mL via INTRAVENOUS

## 2016-07-02 NOTE — Progress Notes (Signed)
Pt feeling better. Iv bag completed. Pt given juice and crackers.

## 2016-07-02 NOTE — MAU Provider Note (Signed)
Chief Complaint:  Nausea and vomiting   First Provider Initiated Contact with Patient 07/02/16 2046     HPI: Heather [name] is a 35 y.o. G4P0 at 8876w6d who presents to maternity admissions reporting intractable nausea/vomiting.   Since Monday, she has not been able to hold anything down, food or liquid. Patient reports almost immediate emesis after eating, including water. Feeling fatigue, dizziness/lightheadedness, headaches, +scitomas. Cramping. +loose stools/diarrhea, three times yesterday, three today. No sick contacts.   Denies contractions, leakage of fluid or vaginal bleeding. Good fetal movement.   Pregnancy Course: Uncomplicated  Past Medical History: Past Medical History:  Diagnosis Date  . Carpal tunnel syndrome, left 06/2012  . Ganglion cyst 06/2012   left volar  . Herpes genitalia     Past obstetric history: OB History  Gravida Para Term Preterm AB Living  4         1  SAB TAB Ectopic Multiple Live Births               # Outcome Date GA Lbr Len/2nd Weight Sex Delivery Anes PTL Lv  4 Current           3 Gravida           2 Gravida           1 [place]Gravida               Past Surgical History: Past Surgical History:  Procedure Laterality Date  . CARPAL TUNNEL RELEASE  06/28/2012   Procedure: CARPAL TUNNEL RELEASE;  Surgeon: Tami RibasKevin R Kuzma, MD;  Location: [facility];  Service: Orthopedics;  Laterality: Left;  EXCISION VOLAR GANGLION CYST and carpal tunnel release left timeouts for ganglion cyst done at same time as timeouts for carpal tunnel with same staffs  . WISDOM TOOTH EXTRACTION       Family History: History reviewed. No pertinent family history.  Social History: Social History  Substance Use Topics  . Smoking status: Never Smoker  . Smokeless tobacco: Never Used  . Alcohol use No    Allergies: No Known Allergies  Meds:  No prescriptions prior to admission.    I have reviewed patient's Past Medical Hx, Surgical Hx, Family Hx, Social  Hx, medications and allergies.   ROS:  Review of Systems  A comprehensive ROS was negative except per HPI.   Physical Exam   Patient Vitals for the past 24 hrs:  BP Temp Temp src Pulse Resp SpO2 Height Weight  07/02/16 2312 (!) 125/45 98.6 F (37 C) Oral 69 17 - - -  07/02/16 1940 129/72 98.8 F (37.1 C) Oral 87 18 99 % 5\' 8"  (1.727 m) 294 lb (133.4 kg)   Constitutional: Well-developed, well-nourished female in no acute distress.  Cardiovascular: normal rate Respiratory: normal effort GI: Abd soft, non-tender, gravid appropriate for gestational age. Pos BS x 4 MS: Extremities nontender, no edema, normal ROM Neurologic: Alert and oriented x 4.  GU: Neg CVAT.   FHT:  Baseline 140 , moderate variability, accelerations present, no decelerations Contractions: None     Labs: Results for orders placed or performed during the hospital encounter of 07/02/16 (from the past 24 hour(s))  Urinalysis, Routine w reflex microscopic (not at [facility])     Status: Abnormal   Collection Time: 07/02/16  7:14 PM  Result Value Ref Range   Color, Urine YELLOW YELLOW   APPearance CLOUDY (A) CLEAR   Specific Gravity, Urine 1.015 1.005 - 1.030   pH 7.5 5.0 -  8.0   Glucose, UA NEGATIVE NEGATIVE mg/dL   Hgb urine dipstick NEGATIVE NEGATIVE   Bilirubin Urine SMALL (A) NEGATIVE   Ketones, ur 15 (A) NEGATIVE mg/dL   Protein, ur 30 (A) NEGATIVE mg/dL   Nitrite NEGATIVE NEGATIVE   Leukocytes, UA MODERATE (A) NEGATIVE  Urine microscopic-add on     Status: Abnormal   Collection Time: 07/02/16  7:14 PM  Result Value Ref Range   Squamous Epithelial / LPF 6-30 (A) NONE SEEN   WBC, UA 6-30 0 - 5 WBC/hpf   RBC / HPF 0-5 0 - 5 RBC/hpf   Bacteria, UA FEW (A) NONE SEEN   Urine-Other MUCOUS PRESENT     Imaging:  No results found.  MAU Course: IVF Zofran  22:48- Patient tolerating crackers and peanut butter  23:01 - Spoke with Dr. Seymour BarsLavoie, who agrees with management and discharge home with PO  anti-emetics. Will given phenergan instead of zofran due to covered by insurance.  I personally reviewed the patient's NST today, found to be REACTIVE.     MDM: Plan of care reviewed with patient, including labs and tests ordered and medical treatment.   Assessment: 1. Intractable vomiting with nausea, vomiting of unspecified type   2. Noninfectious gastroenteritis, unspecified     Plan: Discharge home in stable condition.  Preterm labor precautions and fetal kick counts   Follow-up Information    LAVOIE,MARIE-LYNE, MD Follow up in 1 week(s).   Specialty:  Obstetrics and Gynecology Why:  Follow up and routine OB care Contact information: [address] [place] [id number] [phone]             Medication List    TAKE these medications   prenatal multivitamin Tabs tablet Take 1 tablet by mouth daily at 12 noon.   promethazine 25 MG tablet Commonly known as:  PHENERGAN Take 1 tablet (25 mg total) by mouth every 6 (six) hours as needed for nausea, vomiting or refractory nausea / vomiting.   valACYclovir 500 MG tablet Commonly known as:  VALTREX Take 500 mg by mouth 2 (two) times daily as needed. For outbreak       [place] Sender Rueb, [place] 07/03/2016 3:19 AM

## 2016-07-02 NOTE — Discharge Instructions (Signed)
Hyperemesis Gravidarum Hyperemesis gravidarum is a severe form of nausea and vomiting that happens during pregnancy. Hyperemesis is worse than morning sickness. It may cause you to have nausea or vomiting all day for many days. It may keep you from eating and drinking enough food and liquids. Hyperemesis usually occurs during the first half (the first 20 weeks) of pregnancy. It often goes away once a woman is in her second half of pregnancy. However, sometimes hyperemesis continues through an entire pregnancy.  CAUSES  The cause of this condition is not completely known but is thought to be related to changes in the body's hormones when pregnant. It could be from the high level of the pregnancy hormone or an increase in estrogen in the body.  SIGNS AND SYMPTOMS   Severe nausea and vomiting.  Nausea that does not go away.  Vomiting that does not allow you to keep any food down.  Weight loss and body fluid loss (dehydration).  Having no desire to eat or not liking food you have previously enjoyed. DIAGNOSIS  Your health care provider will do a physical exam and ask you about your symptoms. He or she may also order blood tests and urine tests to make sure something else is not causing the problem.  TREATMENT  You may only need medicine to control the problem. If medicines do not control the nausea and vomiting, you will be treated in the hospital to prevent dehydration, increased acid in the blood (acidosis), weight loss, and changes in the electrolytes in your body that may harm the unborn baby (fetus). You may need IV fluids.  HOME CARE INSTRUCTIONS   Only take over-the-counter or prescription medicines as directed by your health care provider.  Try eating a couple of dry crackers or toast in the morning before getting out of bed.  Avoid foods and smells that upset your stomach.  Avoid fatty and spicy foods.  Eat 5-6 small meals a day.  Do not drink when eating meals. Drink between  meals.  For snacks, eat high-protein foods, such as cheese.  Eat or suck on things that have ginger in them. Ginger helps nausea.  Avoid food preparation. The smell of food can spoil your appetite.  Avoid iron pills and iron in your multivitamins until after 3-4 months of being pregnant. However, consult with your health care provider before stopping any prescribed iron pills. SEEK MEDICAL CARE IF:   Your abdominal pain increases.  You have a severe headache.  You have vision problems.  You are losing weight. SEEK IMMEDIATE MEDICAL CARE IF:   You are unable to keep fluids down.  You vomit blood.  You have constant nausea and vomiting.  You have excessive weakness.  You have extreme thirst.  You have dizziness or fainting.  You have a fever or persistent symptoms for more than 2-3 days.  You have a fever and your symptoms suddenly get worse. MAKE SURE YOU:   Understand these instructions.  Will watch your condition.  Will get help right away if you are not doing well or get worse.   This information is not intended to replace advice given to you by your health care provider. Make sure you discuss any questions you have with your health care provider.   Document Released: 10/27/2005 Document Revised: 08/17/2013 Document Reviewed: 06/08/2013 Elsevier Interactive Patient Education 2016 Elsevier Inc.  Nausea and Vomiting Nausea is a sick feeling that often comes before throwing up (vomiting). Vomiting is a reflex where stomach  contents come out of your mouth. Vomiting can cause severe loss of body fluids (dehydration). Children and elderly adults can become dehydrated quickly, especially if they also have diarrhea. Nausea and vomiting are symptoms of a condition or disease. It is important to find the cause of your symptoms. CAUSES   Direct irritation of the stomach lining. This irritation can result from increased acid production (gastroesophageal reflux disease),  infection, food poisoning, taking certain medicines (such as nonsteroidal anti-inflammatory drugs), alcohol use, or tobacco use.  Signals from the brain.These signals could be caused by a headache, heat exposure, an inner ear disturbance, increased pressure in the brain from injury, infection, a tumor, or a concussion, pain, emotional stimulus, or metabolic problems.  An obstruction in the gastrointestinal tract (bowel obstruction).  Illnesses such as diabetes, hepatitis, gallbladder problems, appendicitis, kidney problems, cancer, sepsis, atypical symptoms of a heart attack, or eating disorders.  Medical treatments such as chemotherapy and radiation.  Receiving medicine that makes you sleep (general anesthetic) during surgery. DIAGNOSIS Your caregiver may ask for tests to be done if the problems do not improve after a few days. Tests may also be done if symptoms are severe or if the reason for the nausea and vomiting is not clear. Tests may include:  Urine tests.  Blood tests.  Stool tests.  Cultures (to look for evidence of infection).  X-rays or other imaging studies. Test results can help your caregiver make decisions about treatment or the need for additional tests. TREATMENT You need to stay well hydrated. Drink frequently but in small amounts.You may wish to drink water, sports drinks, clear broth, or eat frozen ice pops or gelatin dessert to help stay hydrated.When you eat, eating slowly may help prevent nausea.There are also some antinausea medicines that may help prevent nausea. HOME CARE INSTRUCTIONS   Take all medicine as directed by your caregiver.  If you do not have an appetite, do not force yourself to eat. However, you must continue to drink fluids.  If you have an appetite, eat a normal diet unless your caregiver tells you differently.  Eat a variety of complex carbohydrates (rice, wheat, potatoes, bread), lean meats, yogurt, fruits, and vegetables.  Avoid  high-fat foods because they are more difficult to digest.  Drink enough water and fluids to keep your urine clear or pale yellow.  If you are dehydrated, ask your caregiver for specific rehydration instructions. Signs of dehydration may include:  Severe thirst.  Dry lips and mouth.  Dizziness.  Dark urine.  Decreasing urine frequency and amount.  Confusion.  Rapid breathing or pulse. SEEK IMMEDIATE MEDICAL CARE IF:   You have blood or brown flecks (like coffee grounds) in your vomit.  You have black or bloody stools.  You have a severe headache or stiff neck.  You are confused.  You have severe abdominal pain.  You have chest pain or trouble breathing.  You do not urinate at least once every 8 hours.  You develop cold or clammy skin.  You continue to vomit for longer than 24 to 48 hours.  You have a fever. MAKE SURE YOU:   Understand these instructions.  Will watch your condition.  Will get help right away if you are not doing well or get worse.   This information is not intended to replace advice given to you by your health care provider. Make sure you discuss any questions you have with your health care provider.   Document Released: 10/27/2005 Document Revised: 01/19/2012 Document  You develop cold or clammy skin.   You continue to vomit for longer than 24 to 48 hours.   You have a fever.  MAKE SURE YOU:    Understand these instructions.   Will watch your condition.   Will get help right away if you are not doing well or get worse.     This information is not intended to replace advice given to you by your health care provider. Make sure you discuss any questions you have with your health care provider.     Document Released: 10/27/2005 Document Revised: 01/19/2012 Document Reviewed: 03/26/2011  Elsevier Interactive Patient Education 2016 Elsevier Inc.

## 2016-07-02 NOTE — MAU Note (Signed)
Pt reoprts she has been unable to keep hardly anything down for the last 3 days. States she has been vomiting every day during pregnancy but not this bad. Also reports sharp pain every now and then on left lower abd. Feels weak.

## 2016-07-09 ENCOUNTER — Ambulatory Visit (HOSPITAL_COMMUNITY)
Admission: RE | Admit: 2016-07-09 | Discharge: 2016-07-09 | Disposition: A | Discharge status: Home / Self Care | Payer: BLUE CROSS/BLUE SHIELD | Source: Ambulatory Visit | Attending: Obstetrics & Gynecology | Admitting: Obstetrics & Gynecology

## 2016-07-09 ENCOUNTER — Encounter (HOSPITAL_COMMUNITY): Payer: Self-pay

## 2016-07-09 DIAGNOSIS — O26843 Uterine size-date discrepancy, third trimester: Secondary | ICD-10-CM | POA: Diagnosis not present

## 2016-07-09 DIAGNOSIS — Z3A28 28 weeks gestation of pregnancy: Secondary | ICD-10-CM

## 2016-07-11 ENCOUNTER — Encounter (HOSPITAL_COMMUNITY): Payer: Self-pay

## 2016-08-21 LAB — OB RESULTS CONSOLE GBS: STREP GROUP B AG: NEGATIVE

## 2016-09-15 ENCOUNTER — Inpatient Hospital Stay (HOSPITAL_COMMUNITY): Payer: BLUE CROSS/BLUE SHIELD | Admitting: Anesthesiology

## 2016-09-15 ENCOUNTER — Inpatient Hospital Stay (HOSPITAL_COMMUNITY)
Admission: AD | Admit: 2016-09-15 | Discharge: 2016-09-17 | DRG: 775 | Disposition: A | Discharge status: Home / Self Care | Payer: BLUE CROSS/BLUE SHIELD | Source: Ambulatory Visit | Attending: Obstetrics | Admitting: Obstetrics

## 2016-09-15 ENCOUNTER — Encounter (HOSPITAL_COMMUNITY): Payer: Self-pay | Admitting: *Deleted

## 2016-09-15 ENCOUNTER — Encounter (HOSPITAL_COMMUNITY): Payer: Self-pay | Admitting: Anesthesiology

## 2016-09-15 DIAGNOSIS — Z3A39 39 weeks gestation of pregnancy: Secondary | ICD-10-CM

## 2016-09-15 DIAGNOSIS — Z3490 Encounter for supervision of normal pregnancy, unspecified, unspecified trimester: Secondary | ICD-10-CM

## 2016-09-15 DIAGNOSIS — Z6841 Body Mass Index (BMI) 40.0 and over, adult: Secondary | ICD-10-CM | POA: Diagnosis not present

## 2016-09-15 DIAGNOSIS — O99214 Obesity complicating childbirth: Secondary | ICD-10-CM | POA: Diagnosis present

## 2016-09-15 DIAGNOSIS — Z3403 Encounter for supervision of normal first pregnancy, third trimester: Secondary | ICD-10-CM | POA: Diagnosis present

## 2016-09-15 LAB — CBC
HEMATOCRIT: 31.3 % — AB (ref 36.0–46.0)
Hemoglobin: 10.6 g/dL — ABNORMAL LOW (ref 12.0–15.0)
MCH: 30.5 pg (ref 26.0–34.0)
MCHC: 33.9 g/dL (ref 30.0–36.0)
MCV: 89.9 fL (ref 78.0–100.0)
PLATELETS: 203 10*3/uL (ref 150–400)
RBC: 3.48 MIL/uL — ABNORMAL LOW (ref 3.87–5.11)
RDW: 13.2 % (ref 11.5–15.5)
WBC: 9.7 10*3/uL (ref 4.0–10.5)

## 2016-09-15 LAB — TYPE AND SCREEN
ABO/RH(D): O POS
Antibody Screen: NEGATIVE

## 2016-09-15 LAB — ABO/RH: ABO/RH(D): O POS

## 2016-09-15 LAB — RPR: RPR Ser Ql: NONREACTIVE

## 2016-09-15 MED ORDER — PHENYLEPHRINE 40 MCG/ML (10ML) SYRINGE FOR IV PUSH (FOR BLOOD PRESSURE SUPPORT)
80.0000 ug | PREFILLED_SYRINGE | INTRAVENOUS | Status: DC | PRN
  Filled 2016-09-15: qty 5

## 2016-09-15 MED ORDER — DIPHENHYDRAMINE HCL 25 MG PO CAPS: 25.0000 mg | ORAL_CAPSULE | Freq: Four times a day (QID) | ORAL | Status: DC | PRN

## 2016-09-15 MED ORDER — EPHEDRINE 5 MG/ML INJ
10.0000 mg | INTRAVENOUS | Status: DC | PRN
  Filled 2016-09-15: qty 4

## 2016-09-15 MED ORDER — ONDANSETRON HCL 4 MG/2ML IJ SOLN: 4.0000 mg | INTRAMUSCULAR | Status: DC | PRN

## 2016-09-15 MED ORDER — ACETAMINOPHEN 325 MG PO TABS: 650.0000 mg | ORAL_TABLET | ORAL | Status: DC | PRN

## 2016-09-15 MED ORDER — OXYTOCIN 40 UNITS IN LACTATED RINGERS INFUSION - SIMPLE MED
2.5000 [IU]/h | INTRAVENOUS | Status: DC
  Administered 2016-09-15: 2.5 [IU]/h via INTRAVENOUS
  Filled 2016-09-15: qty 1000

## 2016-09-15 MED ORDER — SIMETHICONE 80 MG PO CHEW
80.0000 mg | CHEWABLE_TABLET | ORAL | Status: DC | PRN
Start: 2016-09-15 — End: 2016-09-17

## 2016-09-15 MED ORDER — OXYCODONE-ACETAMINOPHEN 5-325 MG PO TABS: 2.0000 | ORAL_TABLET | ORAL | Status: DC | PRN

## 2016-09-15 MED ORDER — ONDANSETRON HCL 4 MG/2ML IJ SOLN: 4.0000 mg | Freq: Four times a day (QID) | INTRAMUSCULAR | Status: DC | PRN

## 2016-09-15 MED ORDER — COCONUT OIL OIL
1.0000 "application " | TOPICAL_OIL | Status: DC | PRN
  Administered 2016-09-17: 1 via TOPICAL
  Filled 2016-09-15: qty 120

## 2016-09-15 MED ORDER — SENNOSIDES-DOCUSATE SODIUM 8.6-50 MG PO TABS
2.0000 | ORAL_TABLET | ORAL | Status: DC
  Administered 2016-09-15 – 2016-09-16 (×2): 2 via ORAL
  Filled 2016-09-15 (×2): qty 2

## 2016-09-15 MED ORDER — ZOLPIDEM TARTRATE 5 MG PO TABS: 5.0000 mg | ORAL_TABLET | Freq: Every evening | ORAL | Status: DC | PRN

## 2016-09-15 MED ORDER — LIDOCAINE HCL (PF) 1 % IJ SOLN
30.0000 mL | INTRAMUSCULAR | Status: DC | PRN
  Filled 2016-09-15: qty 30

## 2016-09-15 MED ORDER — VALACYCLOVIR HCL 500 MG PO TABS
500.0000 mg | ORAL_TABLET | Freq: Two times a day (BID) | ORAL | Status: DC
  Administered 2016-09-15 – 2016-09-16 (×2): 500 mg via ORAL
  Filled 2016-09-15 (×2): qty 1

## 2016-09-15 MED ORDER — LACTATED RINGERS IV SOLN: 500.0000 mL | INTRAVENOUS | Status: DC | PRN

## 2016-09-15 MED ORDER — OXYCODONE-ACETAMINOPHEN 5-325 MG PO TABS: 1.0000 | ORAL_TABLET | ORAL | Status: DC | PRN

## 2016-09-15 MED ORDER — FENTANYL 2.5 MCG/ML BUPIVACAINE 1/10 % EPIDURAL INFUSION (WH - ANES)
INTRAMUSCULAR | Status: AC
  Filled 2016-09-15: qty 100

## 2016-09-15 MED ORDER — OXYTOCIN BOLUS FROM INFUSION
500.0000 mL | Freq: Once | INTRAVENOUS | Status: AC
  Administered 2016-09-15: 500 mL via INTRAVENOUS

## 2016-09-15 MED ORDER — PHENYLEPHRINE 40 MCG/ML (10ML) SYRINGE FOR IV PUSH (FOR BLOOD PRESSURE SUPPORT)
PREFILLED_SYRINGE | INTRAVENOUS | Status: AC
  Filled 2016-09-15: qty 20

## 2016-09-15 MED ORDER — LIDOCAINE HCL (PF) 1 % IJ SOLN
INTRAMUSCULAR | Status: DC | PRN
  Administered 2016-09-15: 2 mL
  Administered 2016-09-15: 5 mL
  Administered 2016-09-15: 3 mL

## 2016-09-15 MED ORDER — SOD CITRATE-CITRIC ACID 500-334 MG/5ML PO SOLN: 30.0000 mL | ORAL | Status: DC | PRN

## 2016-09-15 MED ORDER — ONDANSETRON HCL 4 MG PO TABS: 4.0000 mg | ORAL_TABLET | ORAL | Status: DC | PRN

## 2016-09-15 MED ORDER — IBUPROFEN 600 MG PO TABS
600.0000 mg | ORAL_TABLET | Freq: Four times a day (QID) | ORAL | Status: DC
  Administered 2016-09-15 – 2016-09-17 (×8): 600 mg via ORAL
  Filled 2016-09-15 (×8): qty 1

## 2016-09-15 MED ORDER — LACTATED RINGERS IV SOLN: 500.0000 mL | Freq: Once | INTRAVENOUS | Status: DC

## 2016-09-15 MED ORDER — DIBUCAINE 1 % RE OINT: 1.0000 "application " | TOPICAL_OINTMENT | RECTAL | Status: DC | PRN

## 2016-09-15 MED ORDER — LACTATED RINGERS IV SOLN
500.0000 mL | Freq: Once | INTRAVENOUS | Status: AC
  Administered 2016-09-15: 500 mL via INTRAVENOUS

## 2016-09-15 MED ORDER — DIPHENHYDRAMINE HCL 50 MG/ML IJ SOLN: 12.5000 mg | INTRAMUSCULAR | Status: DC | PRN

## 2016-09-15 MED ORDER — PRENATAL MULTIVITAMIN CH
1.0000 | ORAL_TABLET | Freq: Every day | ORAL | Status: DC
  Administered 2016-09-16: 1 via ORAL
  Filled 2016-09-15: qty 1

## 2016-09-15 MED ORDER — BENZOCAINE-MENTHOL 20-0.5 % EX AERO
1.0000 "application " | INHALATION_SPRAY | CUTANEOUS | Status: DC | PRN
  Administered 2016-09-16 – 2016-09-17 (×2): 1 via TOPICAL
  Filled 2016-09-15 (×2): qty 56

## 2016-09-15 MED ORDER — TETANUS-DIPHTH-ACELL PERTUSSIS 5-2.5-18.5 LF-MCG/0.5 IM SUSP: 0.5000 mL | Freq: Once | INTRAMUSCULAR | Status: DC

## 2016-09-15 MED ORDER — WITCH HAZEL-GLYCERIN EX PADS: 1.0000 "application " | MEDICATED_PAD | CUTANEOUS | Status: DC | PRN

## 2016-09-15 MED ORDER — FENTANYL 2.5 MCG/ML BUPIVACAINE 1/10 % EPIDURAL INFUSION (WH - ANES)
14.0000 mL/h | INTRAMUSCULAR | Status: DC | PRN
  Administered 2016-09-15: 14 mL/h via EPIDURAL

## 2016-09-15 MED ORDER — LACTATED RINGERS IV SOLN
INTRAVENOUS | Status: DC
  Administered 2016-09-15 (×2): via INTRAVENOUS

## 2016-09-15 NOTE — Anesthesia Postprocedure Evaluation (Signed)
Anesthesia Post Note  Patient: Golden AngolaIsrael  Procedure(s) Performed: * No procedures listed *  Anesthesia Post Evaluation   Last Vitals:  Vitals:   09/15/16 1400 09/15/16 1814  BP: 135/60 125/68  Pulse: (!) 58 71  Resp: 20 20  Temp: 37.2 C 36.8 C    Last Pain:  Vitals:   09/15/16 1814  TempSrc: Oral  PainSc: 0-No pain   Pain Goal:                 Gasper Hopes

## 2016-09-15 NOTE — Anesthesia Procedure Notes (Signed)
Epidural Patient location during procedure: OB  Staffing Anesthesiologist: Marcene DuosFITZGERALD, Jevin Camino Performed: anesthesiologist   Preanesthetic Checklist Completed: patient identified, site marked, surgical consent, pre-op evaluation, timeout performed, IV checked, risks and benefits discussed and monitors and equipment checked  Epidural Patient position: sitting Prep: site prepped and draped and DuraPrep Patient monitoring: continuous pulse ox and blood pressure Approach: midline Location: L4-L5 Injection technique: LOR saline  Needle:  Needle type: Tuohy  Needle gauge: 17 G Needle length: 9 cm and 9 Needle insertion depth: 8 cm Catheter type: closed end flexible Catheter size: 19 Gauge Catheter at skin depth: 16 cm Test dose: negative  Assessment Events: blood not aspirated, injection not painful, no injection resistance, negative IV test and no paresthesia

## 2016-09-15 NOTE — Anesthesia Preprocedure Evaluation (Signed)
Anesthesia Evaluation  Patient identified by MRN, date of birth, ID band Patient awake    Reviewed: Allergy & Precautions, Patient's Chart, lab work & pertinent test results  Airway Mallampati: III  TM Distance: >3 FB     Dental   Pulmonary neg pulmonary ROS,    breath sounds clear to auscultation       Cardiovascular negative cardio ROS   Rhythm:Regular Rate:Normal     Neuro/Psych negative neurological ROS     GI/Hepatic negative GI ROS, Neg liver ROS,   Endo/Other  Morbid obesity  Renal/GU negative Renal ROS     Musculoskeletal   Abdominal   Peds  Hematology  (+) anemia ,   Anesthesia Other Findings   Reproductive/Obstetrics (+) Pregnancy                             Lab Results  Component Value Date   WBC 9.7 09/15/2016   HGB 10.6 (L) 09/15/2016   HCT 31.3 (L) 09/15/2016   MCV 89.9 09/15/2016   PLT 203 09/15/2016   No results found for: CREATININE, BUN, NA, K, CL, CO2  Anesthesia Physical Anesthesia Plan  ASA: III  Anesthesia Plan: Epidural   Post-op Pain Management:    Induction:   Airway Management Planned: Natural Airway  Additional Equipment:   Intra-op Plan:   Post-operative Plan:   Informed Consent: I have reviewed the patients History and Physical, chart, labs and discussed the procedure including the risks, benefits and alternatives for the proposed anesthesia with the patient or authorized representative who has indicated his/her understanding and acceptance.     Plan Discussed with:   Anesthesia Plan Comments:         Anesthesia Quick Evaluation

## 2016-09-15 NOTE — Anesthesia Postprocedure Evaluation (Signed)
Anesthesia Post Note  Patient: Heather [name]  Procedure(s) Performed: * No procedures listed *  Patient location during evaluation: Mother Baby Anesthesia Type: Epidural Level of consciousness: awake and alert Pain management: pain level controlled Vital Signs Assessment: post-procedure vital signs reviewed and stable Respiratory status: spontaneous breathing Cardiovascular status: blood pressure returned to baseline and stable Postop Assessment: no headache, no backache, patient able to bend at knees, no signs of nausea or vomiting and adequate PO intake Anesthetic complications: no     Last Vitals:  Vitals:   09/15/16 1400 09/15/16 1814  BP: 135/60 125/68  Pulse: (!) 58 71  Resp: 20 20  Temp: 37.2 C 36.8 C    Last Pain:  Vitals:   09/15/16 1814  TempSrc: Oral  PainSc: 0-No pain   Pain Goal:                 Heather [name]

## 2016-09-15 NOTE — Progress Notes (Signed)
S: Doing well, no complaints, pain well controlled with epidural  O: BP 116/64   Pulse 87   Temp 98 F (36.7 C) (Oral)   Resp 21   Ht 5\' 8"  (1.727 m)   Wt (!) 139.7 kg (308 lb)   LMP 12/13/2015   SpO2 98%   BMI 46.83 kg/m    FHT:  FHR: 150s bpm, variability: moderate,  accelerations:  Present,  decelerations:  Absent UC:   regular, every 5 minutes SVE:   Dilation: 5 Effacement (%): 80 Station: -3 Exam by:: dr Ernestina Pennafogleman AROM forebag   Pelvic exam: no HSV lesions    A / P:  35 y.o.  Obstetric History   G4   P1   T1   P0   A2   L1    SAB1   TAB1   Ectopic0   Multiple0   Live Births0    at 8282w4d Spontaneous labor, progressing normally  Fetal Wellbeing:  Category I Pain Control:  Epidural  Anticipated MOD:  NSVD  Heather [name] A. 09/15/2016, 9:36 AM

## 2016-09-15 NOTE — Lactation Note (Signed)
This note was copied from a baby's chart. Lactation Consultation Note  Patient Name: Heather [name] Today's Date: 09/15/2016 Reason for consult: Initial assessment Baby at 7 hr of life. Experienced bf mom denies breast or nipple pain, voiced no concerns, reports feedings are going well. Mom bf her older child 3934m with no issues. Upon entry baby was sleeping in visitors arms. Discussed baby behavior, feeding frequency, baby belly size, voids, wt loss, and breast changes. Mom stated she can manually express and has spoon in room. Given lactation handouts. Aware of OP services and support group.       Maternal Data Has patient been taught Hand Expression?: Yes Does the patient have breastfeeding experience prior to this delivery?: Yes  Feeding Feeding Type: Breast Fed Length of feed: 20 min  LATCH Score/Interventions                      Lactation Tools Discussed/Used     Consult Status Consult Status: Follow-up Date: 09/15/16 Follow-up type: In-patient    Heather [name] 09/15/2016, 6:19 PM

## 2016-09-15 NOTE — MAU Note (Signed)
Pt presents with comtractions.

## 2016-09-15 NOTE — H&P (Signed)
Heather [name] is a 35 y.o. [id number] at 5642w4d presenting for active. Pt notes onset contractions last night but progressively getting stronger, now q 5 min . Good fetal movement, No vaginal bleeding, not leaking fluid.  [facility] 8 wks - obesity, 35# wt gain- now >300 #. Needed MFM for completion of anatomy scan, weekly fetal testing after 36 wks - HSV - hyperemesis - LSIL  Prenatal Transfer Tool  Maternal Diabetes: No Genetic Screening: Normal Maternal Ultrasounds/Referrals: Normal Fetal Ultrasounds or other Referrals:  Referred to Materal Fetal Medicine  Maternal Substance Abuse:  No Significant Maternal Medications:  None Significant Maternal Lab Results: None     OB History    Gravida Para Term Preterm AB Living   4 1 1   2 1    SAB TAB Ectopic Multiple Live Births   1 1           Past Medical History:  Diagnosis Date  . Carpal tunnel syndrome, left 06/2012  . Ganglion cyst 06/2012   left volar  . Herpes genitalia    Past Surgical History:  Procedure Laterality Date  . CARPAL TUNNEL RELEASE  06/28/2012   Procedure: CARPAL TUNNEL RELEASE;  Surgeon: Tami RibasKevin R Kuzma, MD;  Location: [facility];  Service: Orthopedics;  Laterality: Left;  EXCISION VOLAR GANGLION CYST and carpal tunnel release left timeouts for ganglion cyst done at same time as timeouts for carpal tunnel with same staffs  . WISDOM TOOTH EXTRACTION     Family History: family history is not on file. Social History:  reports that she has never smoked. She has never used smokeless tobacco. She reports that she does not drink alcohol or use drugs.  Review of Systems - Negative except contractions   Dilation: 4 Effacement (%): 50 Station: -2 Exam by:: V Rogers RN  Blood pressure 141/77, pulse 96, temperature 98 F (36.7 C), temperature source Oral, resp. rate 21, last menstrual period 12/13/2015, SpO2 100 %.  Physical Exam:   Prenatal labs: ABO, Rh:   Antibody:    Rubella: !Error! RPR:    HBsAg:    HIV:    GBS:    neg 1 hr Glucola 123  Genetic screening nl NT, nl AFP Anatomy US nl   Assessment/Plan: 35 y.o. [id number] at 1542w4d Active labor. Admit. Expectant management GBS neg   Heather [name] A. 09/15/2016, 6:56 AM

## 2016-09-16 NOTE — Progress Notes (Signed)
PPD # 1 SVD Information for the patient's newborn:  Heather [name], Heather [name] [[id number]][[id number]]  female    breast feeding   Baby name: Avel SensorJahlani  S:  Reports feeling well.             Tolerating po/ No nausea or vomiting             Bleeding is decreased             Pain controlled with ibuprofen (OTC)             Up ad lib / ambulatory / voiding without difficulties        O:  A & O x 3, in no apparent distress              VS:  Vitals:   09/15/16 1400 09/15/16 1814 09/16/16 0100 09/16/16 0537  BP: 135/60 125/68 (!) 122/58 117/70  Pulse: (!) 58 71 73 70  Resp: 20 20 20 18   Temp: 98.9 F (37.2 C) 98.3 F (36.8 C) 98.5 F (36.9 C) 98.2 F (36.8 C)  TempSrc: Oral Oral Oral Oral  SpO2:      Weight:      Height:        LABS:  Recent Labs  09/15/16 0732  WBC 9.7  HGB 10.6*  HCT 31.3*  PLT 203    Blood type: --/--/O POS, O POS (11/06 0740)  Rubella: Immune (04/20 0000)   I&O: I/O last 3 completed shifts: In: -  Out: 25 [Blood:25]          No intake/output data recorded.  Fundus: deferred, baby at breast  Perineum: iintact  Lochia: small  Extremities: + edema, no calf pain or tenderness    A/P: PPD # 1 35 y.o., [id number]   Principal Problem:   Postpartum care following vaginal delivery (11/6)   Doing well - stable status  Routine post partum orders  Anticipate discharge tomorrow    Heather Mendsaniela C Paul, MSN, CNM 09/16/2016, 9:03 AM

## 2016-09-16 NOTE — Lactation Note (Signed)
This note was copied from a baby's chart. Lactation Consultation Note  Patient Name: Girl Demara AngolaIsrael Today's Date: 09/16/2016 Reason for consult: Follow-up assessment  Baby 26 hours old. Mom reports that baby nursed well through the night, but has seemed somewhat sleepy at the breast today. Enc mom to nurse STS. Mom has large pendulous breast. She states that she nursed her first child for 8 months with a good milk supply. Assisted with latching baby to right breast in football position. Baby latched deeply and suckled rhythmically with a few swallows noted. Lifted mom's breast out of the way to see baby's full jaw excursion. Mom denies any nipple pain. Mom able to hand express with colostrum present, and mom reports that she has been seeing colostrum. Discussed progression of milk coming to volume.   Enc mom to nurse baby STS and demonstrated how to stimulate baby to continue suckling. Enc mom to keep nursing with cues and to call for assistance as needed.  Maternal Data    Feeding Feeding Type: Breast Fed Length of feed:  (LC assessed first 10 minutes of BF. )  LATCH Score/Interventions Latch: Grasps breast easily, tongue down, lips flanged, rhythmical sucking.  Audible Swallowing: A few with stimulation Intervention(s): Skin to skin;Hand expression  Type of Nipple: Everted at rest and after stimulation  Comfort (Breast/Nipple): Soft / non-tender     Hold (Positioning): Assistance needed to correctly position infant at breast and maintain latch. Intervention(s): Breastfeeding basics reviewed;Support Pillows;Skin to skin  LATCH Score: 8  Lactation Tools Discussed/Used     Consult Status Consult Status: Follow-up Date: 09/17/16 Follow-up type: In-patient    Sherlyn HayJennifer D Nadine Ryle 09/16/2016, 1:30 PM

## 2016-09-16 NOTE — Plan of Care (Signed)
Problem: Life Cycle: Goal: Risk for postpartum hemorrhage will decrease Outcome: Completed/Met Date Met: 09/16/16 Encouraged patient to call if bleeding increases significantly or large clots are noted.

## 2016-09-17 MED ORDER — IBUPROFEN 600 MG PO TABS: 600.0000 mg | ORAL_TABLET | Freq: Four times a day (QID) | ORAL | 0 refills | Status: DC

## 2016-09-17 NOTE — Anesthesia Postprocedure Evaluation (Signed)
Anesthesia Post Note  Patient: Anneke AngolaIsrael  Procedure(s) Performed: * No procedures listed *  Patient location during evaluation: Mother Baby Anesthesia Type: Epidural Level of consciousness: awake and alert Pain management: pain level controlled Vital Signs Assessment: post-procedure vital signs reviewed and stable Respiratory status: spontaneous breathing, nonlabored ventilation and respiratory function stable Cardiovascular status: stable Postop Assessment: no headache, no backache and epidural receding Anesthetic complications: no    Last Vitals:  Vitals:   09/16/16 1748 09/17/16 0618  BP: 117/60 138/73  Pulse: 78 76  Resp: 18 16  Temp: 36.9 C 37.2 C    Last Pain:  Vitals:   09/17/16 0618  TempSrc: Oral  PainSc:                  Kennieth RadFitzgerald, Mikesha Migliaccio E

## 2016-09-17 NOTE — Lactation Note (Signed)
This note was copied from a baby's chart. Lactation Consultation Note  Patient Name: Girl Solara AngolaIsrael Today's Date: 09/17/2016 Reason for consult: Follow-up assessment Follow up visit made prior to discharge.  Mom is currently breastfeeding baby using football hold.  Baby is latched well and nursing actively.  Reviewed waking techniques and breast massage.  Discussed milk coming to volume.  Discharge instructions given. Questions answered. Outpatient lactation services and support information reviewed and encouraged.  Maternal Data    Feeding Feeding Type: Breast Fed Length of feed: 15 min  LATCH Score/Interventions Latch: Grasps breast easily, tongue down, lips flanged, rhythmical sucking. Intervention(s): Teach feeding cues;Waking techniques Intervention(s): Breast massage;Breast compression  Audible Swallowing: A few with stimulation Intervention(s): Alternate breast massage  Type of Nipple: Everted at rest and after stimulation  Comfort (Breast/Nipple): Soft / non-tender  Problem noted: Mild/Moderate discomfort Interventions (Mild/moderate discomfort): Hand expression  Hold (Positioning): No assistance needed to correctly position infant at breast. Intervention(s): Breastfeeding basics reviewed;Support Pillows;Skin to skin  LATCH Score: 9  Lactation Tools Discussed/Used     Consult Status Consult Status: Complete    Huston FoleyMOULDEN, Kailene Steinhart S 09/17/2016, 9:58 AM

## 2016-09-17 NOTE — Progress Notes (Signed)
PPD 2 SVD  S:  Reports feeling well             Tolerating po/ No nausea or vomiting             Bleeding is light             Pain controlled with motrin mostly             Up ad lib / ambulatory / voiding QS  Newborn breast feeding  O:               VS: BP 138/73 (BP Location: Left Arm)   Pulse 76   Temp 98.9 F (37.2 C) (Oral)   Resp 16   Ht 5\' 8"  (1.727 m)   Wt (!) 139.7 kg (308 lb)   LMP 12/13/2015   SpO2 100%   Breastfeeding? Unknown   BMI 46.83 kg/m    LABS:              Recent Labs  09/15/16 0732  WBC 9.7  HGB 10.6*  PLT 203               Blood type: --/--/O POS, O POS (11/06 0740)  Rubella: Immune (04/20 0000)                          Physical Exam:             Alert and oriented X3  Abdomen: soft, non-tender, non-distended              Fundus: firm, non-tender, U-1  Perineum: no edema  Lochia: light  Extremities: trace edema, no calf pain or tenderness    A: PPD # 2   Doing well - stable status  P: Routine post partum orders  DC home - WOB booklet - instructions reviewed  Marlinda MikeBAILEY, Gleb Mcguire CNM, MSN, [facility] 09/17/2016, 7:52 AM

## 2016-09-17 NOTE — Discharge Summary (Signed)
Obstetric Discharge Summary Reason for Admission: onset of labor Prenatal Procedures: ultrasound Intrapartum Procedures: spontaneous vaginal delivery Postpartum Procedures: none Complications-Operative and Postpartum: none Hemoglobin  Date Value Ref Range Status  09/15/2016 10.6 (L) 12.0 - 15.0 g/dL Final   HCT  Date Value Ref Range Status  09/15/2016 31.3 (L) 36.0 - 46.0 % Final   Tdap and flu vaccines current 2017  Physical Exam:  General: alert, cooperative, appears stated age and no distress Lochia: appropriate Uterine Fundus: firm Perineum-intact DVT Evaluation: No evidence of DVT seen on physical exam.  Discharge Diagnoses: Term Pregnancy-delivered  Discharge Information: Date: 09/17/2016 Activity: pelvic rest Diet: routine Medications: PNV and Ibuprofen Condition: stable Instructions: refer to practice specific booklet Discharge to: home Follow-up Information    MODY,VAISHALI R, MD. Schedule an appointment as soon as possible for a visit in 6 week(s).   Specialty:  Obstetrics and Gynecology Contact information: [place] [place] [id number] [phone]           Newborn Data: Live born female  Birth Weight: 6 lb 7.2 oz (2925 g) APGAR: 8, 9  Home with mother.  Heather MikeBAILEY, Heather [name] 09/17/2016, 9:41 AM

## 2017-02-20 ENCOUNTER — Ambulatory Visit (INDEPENDENT_AMBULATORY_CARE_PROVIDER_SITE_OTHER): Payer: BLUE CROSS/BLUE SHIELD | Admitting: Orthopaedic Surgery

## 2017-02-20 DIAGNOSIS — G5601 Carpal tunnel syndrome, right upper limb: Secondary | ICD-10-CM | POA: Diagnosis not present

## 2017-02-20 DIAGNOSIS — G5602 Carpal tunnel syndrome, left upper limb: Secondary | ICD-10-CM

## 2017-02-20 NOTE — Progress Notes (Signed)
Office Visit Note   Patient: Heather [name]           Date of Birth: August 26, 1981           MRN: [id number] Visit Date: 02/20/2017              Requested by: [place] [id number] [facility] [address] [facility] [place], [place] [address] PCP: No PCP Per Patient   Assessment & Plan: Visit Diagnoses:  1. Right carpal tunnel syndrome   2. Left carpal tunnel syndrome     Plan: Recommend new nerve conduction studies to compare to old ones. She will obtain old nerve conduction studies for comparison from Dr. Merrilee Seashore office.I will see her back in a couple weeks.  Follow-Up Instructions: Return in about 2 weeks (around 03/06/2017).   Orders:  Orders Placed This Encounter  Procedures  . Ambulatory referral to Physical Medicine Rehab   No orders of the defined types were placed in this encounter.     Procedures: No procedures performed   Clinical Data: No additional findings.   Subjective: Chief Complaint  Patient presents with  . Left Hand - Pain  . Right Hand - Pain    [name] is a 36 year old female with bilateral carpal tunnel syndrome diagnosed by nerve conduction studies several years ago which showed that it was severe. She did have a left carpal tunnel release by Betha Loa about 5 years ago. She did get relief but now has had recurrent carpal tunnel symptoms. This is gotten recently worse with her pregnancy but now her child is 90 months old. She continues to have pain in her palm with decreased grip strength and tingling numbness and burning. She is right-handed.    Review of Systems  Constitutional: Negative.   HENT: Negative.   Eyes: Negative.   Respiratory: Negative.   Cardiovascular: Negative.   Endocrine: Negative.   Musculoskeletal: Negative.   Neurological: Negative.   Hematological: Negative.   Psychiatric/Behavioral: Negative.   All other systems reviewed and are negative.    Objective: Vital Signs: There were no vitals  taken for this visit.  Physical Exam  Constitutional: She is oriented to person, place, and time. She appears well-developed and well-nourished.  HENT:  Head: Normocephalic and atraumatic.  Eyes: EOM are normal.  Neck: Neck supple.  Pulmonary/Chest: Effort normal.  Abdominal: Soft.  Neurological: She is alert and oriented to person, place, and time.  Skin: Skin is warm. Capillary refill takes less than 2 seconds.  Psychiatric: She has a normal mood and affect. Her behavior is normal. Judgment and thought content normal.  Nursing note and vitals reviewed.   Ortho Exam Right hand exam shows no evidence of muscular atrophy. Negative carpal tunnel compressive signs. No skin changes. Left hand exam shows the postsurgical scar which is well-healed. The scar is nontender. Also negative carpal tunnel compressive signs. Specialty Comments:  No specialty comments available.  Imaging: No results found.   PMFS History: Patient Active Problem List   Diagnosis Date Noted  . Postpartum care following vaginal delivery (11/6) 09/15/2016   Past Medical History:  Diagnosis Date  . Carpal tunnel syndrome, left 06/2012  . Ganglion cyst 06/2012   left volar  . Herpes genitalia   . Postpartum care following vaginal delivery (11/6) 09/15/2016    No family history on file.  Past Surgical History:  Procedure Laterality Date  . CARPAL TUNNEL RELEASE  06/28/2012   Procedure: CARPAL TUNNEL RELEASE;  Surgeon: Tami Ribas, MD;  Location: [facility];  Service: Orthopedics;  Laterality: Left;  EXCISION VOLAR GANGLION CYST and carpal tunnel release left timeouts for ganglion cyst done at same time as timeouts for carpal tunnel with same staffs  . WISDOM TOOTH EXTRACTION     Social History   Occupational History  . Not on file.   Social History Main Topics  . Smoking status: Never Smoker  . Smokeless tobacco: Never Used  . Alcohol use No  . Drug use: No  . Sexual activity: Yes     Birth control/ protection: None

## 2017-03-05 ENCOUNTER — Ambulatory Visit (INDEPENDENT_AMBULATORY_CARE_PROVIDER_SITE_OTHER): Payer: BLUE CROSS/BLUE SHIELD | Admitting: Physical Medicine and Rehabilitation

## 2017-03-05 ENCOUNTER — Encounter (INDEPENDENT_AMBULATORY_CARE_PROVIDER_SITE_OTHER): Payer: Self-pay | Admitting: Physical Medicine and Rehabilitation

## 2017-03-05 DIAGNOSIS — R202 Paresthesia of skin: Secondary | ICD-10-CM | POA: Diagnosis not present

## 2017-03-05 NOTE — Procedures (Signed)
EMG & NCV Findings: Evaluation of the right median (across palm) sensory nerve showed prolonged distal peak latency (Wrist, 4.9 ms).  All remaining nerves (as indicated in the following tables) were within normal limits.  Left vs. Right side comparison data for the median motor nerve indicates abnormal L-R velocity difference (Elbow-Wrist, 11 m/s).  All remaining left vs. right side differences were within normal limits.    All examined muscles (as indicated in the following table) showed no evidence of electrical instability.    Impression: The above electrodiagnostic study is ABNORMAL and reveals evidence of a mild right median nerve entrapment at the wrist (carpal tunnel syndrome) affecting sensory components.   There is no significant electrodiagnostic evidence of any other focal nerve entrapment, brachial plexopathy or cervical radiculopathy.  As you know, this particular electrodiagnostic study cannot rule out chemical radiculitis or sensory only radiculopathy.  This electrodiagnostic study cannot rule out small fiber polyneuropathy and dysesthesias from central pain sensitization syndromes such as fibromyalgia.  Recommendations: 1.  Follow-up with referring physician. 2.  Continue current management of symptoms.    Nerve Conduction Studies Anti Sensory Summary Table   Stim Site NR Peak (ms) Norm Peak (ms) P-T Amp (V) Norm P-T Amp Site1 Site2 Delta-P (ms) Dist (cm) Vel (m/s) Norm Vel (m/s)  Left Median Acr Palm Anti Sensory (2nd Digit)  31.7C  Wrist    3.1 <3.6 33.8 >10 Wrist Palm 1.4 0.0    Palm    1.7 <2.0 39.2         Right Median Acr Palm Anti Sensory (2nd Digit)  32.3C  Wrist    *4.9 <3.6 20.5 >10 Wrist Palm 3.1 0.0    Palm    1.8 <2.0 12.6         Left Radial Anti Sensory (Base 1st Digit)  31.9C  Wrist    1.6 <3.1 30.6  Wrist Base 1st Digit 1.6 0.0    Right Radial Anti Sensory (Base 1st Digit)  34.4C  Wrist    1.8 <3.1 29.7  Wrist Base 1st Digit 1.8 0.0    Left  Ulnar Anti Sensory (5th Digit)  32C  Wrist    3.2 <3.7 39.3 >15.0 Wrist 5th Digit 3.2 14.0 44 >38  Right Ulnar Anti Sensory (5th Digit)  33.2C  Wrist    3.1 <3.7 22.4 >15.0 Wrist 5th Digit 3.1 14.0 45 >38   Motor Summary Table   Stim Site NR Onset (ms) Norm Onset (ms) O-P Amp (mV) Norm O-P Amp Site1 Site2 Delta-0 (ms) Dist (cm) Vel (m/s) Norm Vel (m/s)  Left Median Motor (Abd Poll Brev)  32C  Wrist    3.2 <4.2 9.9 >5 Elbow Wrist 4.1 25.0 61 >50  Elbow    7.3  4.6         Right Median Motor (Abd Poll Brev)  33.9C  Wrist    3.5 <4.2 8.6 >5 Elbow Wrist 5.3 26.5 50 >50  Elbow    8.8  7.9         Left Ulnar Motor (Abd Dig Min)  32.1C  Wrist    2.7 <4.2 12.7 >3 B Elbow Wrist 3.9 24.0 62 >53  B Elbow    6.6  12.1  A Elbow B Elbow 1.3 11.0 85 >53  A Elbow    7.9  11.7         Right Ulnar Motor (Abd Dig Min)  33.3C  Wrist    3.1 <4.2 11.6 >3 B Elbow Wrist 3.7  23.0 62 >53  B Elbow    6.8  11.6  A Elbow B Elbow 1.2 11.0 92 >53  A Elbow    8.0  11.9          EMG   Side Muscle Nerve Root Ins Act Fibs Psw Amp Dur Poly Recrt Int Dennie Bible Comment  Right Abd Poll Brev Median C8-T1 Nml Nml Nml Nml Nml 0 Nml Nml   Right 1stDorInt Ulnar C8-T1 Nml Nml Nml Nml Nml 0 Nml Nml   Right PronatorTeres Median C6-7 Nml Nml Nml Nml Nml 0 Nml Nml   Right Biceps Musculocut C5-6 Nml Nml Nml Nml Nml 0 Nml Nml   Right Deltoid Axillary C5-6 Nml Nml Nml Nml Nml 0 Nml Nml     Nerve Conduction Studies Anti Sensory Left/Right Comparison   Stim Site L Lat (ms) R Lat (ms) L-R Lat (ms) L Amp (V) R Amp (V) L-R Amp (%) Site1 Site2 L Vel (m/s) R Vel (m/s) L-R Vel (m/s)  Median Acr Palm Anti Sensory (2nd Digit)  31.7C  Wrist 3.1 *4.9 1.8 33.8 20.5 39.3 Wrist Palm     Palm 1.7 1.8 0.1 39.2 12.6 67.9       Radial Anti Sensory (Base 1st Digit)  31.9C  Wrist 1.6 1.8 0.2 30.6 29.7 2.9 Wrist Base 1st Digit     Ulnar Anti Sensory (5th Digit)  32C  Wrist 3.2 3.1 0.1 39.3 22.4 43.0 Wrist 5th Digit 44 45 1   Motor  Left/Right Comparison   Stim Site L Lat (ms) R Lat (ms) L-R Lat (ms) L Amp (mV) R Amp (mV) L-R Amp (%) Site1 Site2 L Vel (m/s) R Vel (m/s) L-R Vel (m/s)  Median Motor (Abd Poll Brev)  32C  Wrist 3.2 3.5 0.3 9.9 8.6 13.1 Elbow Wrist 61 50 *11  Elbow 7.3 8.8 1.5 4.6 7.9 41.8       Ulnar Motor (Abd Dig Min)  32.1C  Wrist 2.7 3.1 0.4 12.7 11.6 8.7 B Elbow Wrist 62 62 0  B Elbow 6.6 6.8 0.2 12.1 11.6 4.1 A Elbow B Elbow 85 92 7  A Elbow 7.9 8.0 0.1 11.7 11.9 1.7

## 2017-03-05 NOTE — Progress Notes (Deleted)
Weakness and loss of strength in both hands. Pain in left palm and thumb. Numbness in right hand. Recently lost strength when holding baby and dropped her. Also has pain at base of neck which comes and goes. Symptoms have been present for years and getting worse for the past few months.  Right hand dominant.

## 2017-03-05 NOTE — Progress Notes (Signed)
Heather [name] - 36 y.o. female MRN [id number]  Date of birth: 06/23/1981  Office Visit Note: Visit Date: 03/05/2017 PCP: No PCP Per Patient Referred by: No ref. provider found  Subjective: Chief Complaint  Patient presents with  . Right Hand - Numbness, Weakness  . Left Hand - Weakness   HPI: Heather [name] is a 36 year old female who is right-hand dominant with prior history of left carpal tunnel release by Dr. Betha Loa in 2013. She evidently had a bowl R cystic structure that was also removed. I do not see any prior electrodiagnostic studies. She says that she had those done in Dr. Roda Shutters had requested getting those and she had signs of paperwork to have this faxed in. According to Dr. Merrilee Seashore notes at the time she had a positive nerve conduction study but he did not state on the history and physical at the time the exact severity on the nerve conduction studies. Nonetheless she comes in today after seeing Dr. Roda Shutters for bilateral hand pain with numbness and tingling right more than left. She gets all of her fingers numb on the right hand in a nondermatomal global fashion. It is seemingly very constant. She's had weakness where she has almost dropped her new young child. She really can't do a lot with her hands because of the weakness and numbness. On the left and she gets more tingling numbness in the thumb. She also endorses some neck pain but no frank radicular pain. She's had no frank trauma.    ROS Otherwise per HPI.  Assessment & Plan: Visit Diagnoses:  1. Paresthesia of skin     Plan: No additional findings.  Impression: The above electrodiagnostic study is ABNORMAL and reveals evidence of a mild right median nerve entrapment at the wrist (carpal tunnel syndrome) affecting sensory components.   There is no significant electrodiagnostic evidence of any other focal nerve entrapment, brachial plexopathy or cervical radiculopathy.  As you know, this particular electrodiagnostic study  cannot rule out chemical radiculitis or sensory only radiculopathy.  This electrodiagnostic study cannot rule out small fiber polyneuropathy and dysesthesias from central pain sensitization syndromes such as fibromyalgia.  Recommendations: 1.  Follow-up with referring physician. 2.  Continue current management of symptoms. Suggest wrist splint at night and may consider diagnostic carpal tunnel injection.   Meds & Orders: No orders of the defined types were placed in this encounter.   Orders Placed This Encounter  Procedures  . NCV with EMG (electromyography)    Follow-up: Return for EMG/NCS review tommorrow with Dr. Roda Shutters.   Procedures: No procedures performed  EMG & NCV Findings: Evaluation of the right median (across palm) sensory nerve showed prolonged distal peak latency (Wrist, 4.9 ms).  All remaining nerves (as indicated in the following tables) were within normal limits.  Left vs. Right side comparison data for the median motor nerve indicates abnormal L-R velocity difference (Elbow-Wrist, 11 m/s).  All remaining left vs. right side differences were within normal limits.    All examined muscles (as indicated in the following table) showed no evidence of electrical instability.    Impression: The above electrodiagnostic study is ABNORMAL and reveals evidence of a mild right median nerve entrapment at the wrist (carpal tunnel syndrome) affecting sensory components.   There is no significant electrodiagnostic evidence of any other focal nerve entrapment, brachial plexopathy or cervical radiculopathy.  As you know, this particular electrodiagnostic study cannot rule out chemical radiculitis or sensory only radiculopathy.  This electrodiagnostic study cannot rule  out small fiber polyneuropathy and dysesthesias from central pain sensitization syndromes such as fibromyalgia.  Recommendations: 1.  Follow-up with referring physician. 2.  Continue current management of  symptoms.    Nerve Conduction Studies Anti Sensory Summary Table   Stim Site NR Peak (ms) Norm Peak (ms) P-T Amp (V) Norm P-T Amp Site1 Site2 Delta-P (ms) Dist (cm) Vel (m/s) Norm Vel (m/s)  Left Median Acr Palm Anti Sensory (2nd Digit)  31.7C  Wrist    3.1 <3.6 33.8 >10 Wrist Palm 1.4 0.0    Palm    1.7 <2.0 39.2         Right Median Acr Palm Anti Sensory (2nd Digit)  32.3C  Wrist    *4.9 <3.6 20.5 >10 Wrist Palm 3.1 0.0    Palm    1.8 <2.0 12.6         Left Radial Anti Sensory (Base 1st Digit)  31.9C  Wrist    1.6 <3.1 30.6  Wrist Base 1st Digit 1.6 0.0    Right Radial Anti Sensory (Base 1st Digit)  34.4C  Wrist    1.8 <3.1 29.7  Wrist Base 1st Digit 1.8 0.0    Left Ulnar Anti Sensory (5th Digit)  32C  Wrist    3.2 <3.7 39.3 >15.0 Wrist 5th Digit 3.2 14.0 44 >38  Right Ulnar Anti Sensory (5th Digit)  33.2C  Wrist    3.1 <3.7 22.4 >15.0 Wrist 5th Digit 3.1 14.0 45 >38   Motor Summary Table   Stim Site NR Onset (ms) Norm Onset (ms) O-P Amp (mV) Norm O-P Amp Site1 Site2 Delta-0 (ms) Dist (cm) Vel (m/s) Norm Vel (m/s)  Left Median Motor (Abd Poll Brev)  32C  Wrist    3.2 <4.2 9.9 >5 Elbow Wrist 4.1 25.0 61 >50  Elbow    7.3  4.6         Right Median Motor (Abd Poll Brev)  33.9C  Wrist    3.5 <4.2 8.6 >5 Elbow Wrist 5.3 26.5 50 >50  Elbow    8.8  7.9         Left Ulnar Motor (Abd Dig Min)  32.1C  Wrist    2.7 <4.2 12.7 >3 B Elbow Wrist 3.9 24.0 62 >53  B Elbow    6.6  12.1  A Elbow B Elbow 1.3 11.0 85 >53  A Elbow    7.9  11.7         Right Ulnar Motor (Abd Dig Min)  33.3C  Wrist    3.1 <4.2 11.6 >3 B Elbow Wrist 3.7 23.0 62 >53  B Elbow    6.8  11.6  A Elbow B Elbow 1.2 11.0 92 >53  A Elbow    8.0  11.9          EMG   Side Muscle Nerve Root Ins Act Fibs Psw Amp Dur Poly Recrt Int Dennie Bible Comment  Right Abd Poll Brev Median C8-T1 Nml Nml Nml Nml Nml 0 Nml Nml   Right 1stDorInt Ulnar C8-T1 Nml Nml Nml Nml Nml 0 Nml Nml   Right PronatorTeres Median C6-7 Nml Nml Nml Nml  Nml 0 Nml Nml   Right Biceps Musculocut C5-6 Nml Nml Nml Nml Nml 0 Nml Nml   Right Deltoid Axillary C5-6 Nml Nml Nml Nml Nml 0 Nml Nml     Nerve Conduction Studies Anti Sensory Left/Right Comparison   Stim Site L Lat (ms) R Lat (ms) L-R Lat (ms) L Amp (V)  R Amp (V) L-R Amp (%) Site1 Site2 L Vel (m/s) R Vel (m/s) L-R Vel (m/s)  Median Acr Palm Anti Sensory (2nd Digit)  31.7C  Wrist 3.1 *4.9 1.8 33.8 20.5 39.3 Wrist Palm     Palm 1.7 1.8 0.1 39.2 12.6 67.9       Radial Anti Sensory (Base 1st Digit)  31.9C  Wrist 1.6 1.8 0.2 30.6 29.7 2.9 Wrist Base 1st Digit     Ulnar Anti Sensory (5th Digit)  32C  Wrist 3.2 3.1 0.1 39.3 22.4 43.0 Wrist 5th Digit 44 45 1   Motor Left/Right Comparison   Stim Site L Lat (ms) R Lat (ms) L-R Lat (ms) L Amp (mV) R Amp (mV) L-R Amp (%) Site1 Site2 L Vel (m/s) R Vel (m/s) L-R Vel (m/s)  Median Motor (Abd Poll Brev)  32C  Wrist 3.2 3.5 0.3 9.9 8.6 13.1 Elbow Wrist 61 50 *11  Elbow 7.3 8.8 1.5 4.6 7.9 41.8       Ulnar Motor (Abd Dig Min)  32.1C  Wrist 2.7 3.1 0.4 12.7 11.6 8.7 B Elbow Wrist 62 62 0  B Elbow 6.6 6.8 0.2 12.1 11.6 4.1 A Elbow B Elbow 85 92 7  A Elbow 7.9 8.0 0.1 11.7 11.9 1.7             Clinical History: No specialty comments available.  She reports that she has never smoked. She has never used smokeless tobacco. No results for input(s): HGBA1C, LABURIC in the last 8760 hours.  Objective:  VS:  HT:    WT:   BMI:     BP:   HR: bpm  TEMP: ( )  RESP:  Physical Exam  Musculoskeletal:  Inspection reveals no atrophy of the bilateral APB or FDI or hand intrinsics. Well-healed volar wrist surgical scar on the left. There is no swelling, color changes, allodynia or dystrophic changes. There is 5 out of 5 strength in the bilateral wrist extension, finger abduction and long finger flexion. There is intact sensation to light touch in all dermatomal and peripheral nerve distributions.     Ortho Exam Imaging: No results found.  Past  Medical/Family/Surgical/Social History: Medications & Allergies reviewed per EMR Patient Active Problem List   Diagnosis Date Noted  . Postpartum care following vaginal delivery (11/6) 09/15/2016   Past Medical History:  Diagnosis Date  . Carpal tunnel syndrome, left 06/2012  . Ganglion cyst 06/2012   left volar  . Herpes genitalia   . Postpartum care following vaginal delivery (11/6) 09/15/2016   History reviewed. No pertinent family history. Past Surgical History:  Procedure Laterality Date  . CARPAL TUNNEL RELEASE  06/28/2012   Procedure: CARPAL TUNNEL RELEASE;  Surgeon: Tami Ribas, MD;  Location: [facility];  Service: Orthopedics;  Laterality: Left;  EXCISION VOLAR GANGLION CYST and carpal tunnel release left timeouts for ganglion cyst done at same time as timeouts for carpal tunnel with same staffs  . WISDOM TOOTH EXTRACTION     Social History   Occupational History  . Not on file.   Social History Main Topics  . Smoking status: Never Smoker  . Smokeless tobacco: Never Used  . Alcohol use No  . Drug use: No  . Sexual activity: Yes    Birth control/ protection: None

## 2017-03-06 ENCOUNTER — Ambulatory Visit (INDEPENDENT_AMBULATORY_CARE_PROVIDER_SITE_OTHER): Payer: BLUE CROSS/BLUE SHIELD | Admitting: Orthopaedic Surgery

## 2017-03-06 DIAGNOSIS — G5602 Carpal tunnel syndrome, left upper limb: Secondary | ICD-10-CM

## 2017-03-06 DIAGNOSIS — G5601 Carpal tunnel syndrome, right upper limb: Secondary | ICD-10-CM | POA: Diagnosis not present

## 2017-03-06 MED ORDER — LIDOCAINE HCL 1 % IJ SOLN
0.5000 mL | INTRAMUSCULAR | Status: AC | PRN
  Administered 2017-03-06: .5 mL

## 2017-03-06 MED ORDER — BUPIVACAINE HCL 0.5 % IJ SOLN
0.5000 mL | INTRAMUSCULAR | Status: AC | PRN
  Administered 2017-03-06: .5 mL

## 2017-03-06 MED ORDER — METHYLPREDNISOLONE ACETATE 40 MG/ML IJ SUSP
20.0000 mg | INTRAMUSCULAR | Status: AC | PRN
  Administered 2017-03-06: 20 mg

## 2017-03-06 NOTE — Progress Notes (Signed)
   Office Visit Note   Patient: Heather [name]           Date of Birth: [birth year]           MRN: [id number] Visit Date: 03/06/2017              Requested by: No referring provider defined for this encounter. PCP: No PCP Per Patient   Assessment & Plan: Visit Diagnoses:  1. Right carpal tunnel syndrome     Plan: Left carpal tunnel injection was performed today. She has a baby that she is caring for. I'll like to bring her back next week for another injection on the right hand.  Follow-Up Instructions: No Follow-up on file.   Orders:  No orders of the defined types were placed in this encounter.  No orders of the defined types were placed in this encounter.     Procedures: Hand/UE Inj Date/Time: 03/06/2017 9:51 AM Performed by: Tarry Kos Authorized by: Tarry Kos   Consent Given by:  Patient Timeout: prior to procedure the correct patient, procedure, and site was verified   Condition: carpal tunnel   Site:  L carpal tunnel Prep: patient was prepped and draped in usual sterile fashion   Needle Size:  25 G Approach:  Volar Medications:  0.5 mL lidocaine 1 %; 0.5 mL bupivacaine 0.5 %; 20 mg methylPREDNISolone acetate 40 MG/ML     Clinical Data: No additional findings.   Subjective: No chief complaint on file.   Patient back today to review the studies. She was found to have mild carpal tunnel syndrome on the right. She continues to complain of left hand pain consistent with carpal tunnel syndrome. She was told that it was severe 5 years ago at the time surgery.    Review of Systems   Objective: Vital Signs: There were no vitals taken for this visit.  Physical Exam  Ortho Exam Exam is stable. Specialty Comments:  No specialty comments available.  Imaging: No results found.   PMFS History: Patient Active Problem List   Diagnosis Date Noted  . Postpartum care following vaginal delivery (11/6) 09/15/2016   Past Medical History:  Diagnosis  Date  . Carpal tunnel syndrome, left 06/2012  . Ganglion cyst 06/2012   left volar  . Herpes genitalia   . Postpartum care following vaginal delivery (11/6) 09/15/2016    No family history on file.  Past Surgical History:  Procedure Laterality Date  . CARPAL TUNNEL RELEASE  06/28/2012   Procedure: CARPAL TUNNEL RELEASE;  Surgeon: Tami Ribas, MD;  Location: [facility];  Service: Orthopedics;  Laterality: Left;  EXCISION VOLAR GANGLION CYST and carpal tunnel release left timeouts for ganglion cyst done at same time as timeouts for carpal tunnel with same staffs  . WISDOM TOOTH EXTRACTION     Social History   Occupational History  . Not on file.   Social History Main Topics  . Smoking status: Never Smoker  . Smokeless tobacco: Never Used  . Alcohol use No  . Drug use: No  . Sexual activity: Yes    Birth control/ protection: None

## 2017-03-13 ENCOUNTER — Ambulatory Visit (INDEPENDENT_AMBULATORY_CARE_PROVIDER_SITE_OTHER): Payer: BLUE CROSS/BLUE SHIELD | Admitting: Orthopaedic Surgery

## 2017-05-31 ENCOUNTER — Ambulatory Visit (HOSPITAL_COMMUNITY)
Admission: EM | Admit: 2017-05-31 | Discharge: 2017-05-31 | Disposition: A | Discharge status: Home / Self Care | Payer: BLUE CROSS/BLUE SHIELD | Attending: Family Medicine | Admitting: Family Medicine

## 2017-05-31 ENCOUNTER — Ambulatory Visit (INDEPENDENT_AMBULATORY_CARE_PROVIDER_SITE_OTHER): Payer: BLUE CROSS/BLUE SHIELD

## 2017-05-31 ENCOUNTER — Encounter (HOSPITAL_COMMUNITY): Payer: Self-pay

## 2017-05-31 DIAGNOSIS — M25562 Pain in left knee: Secondary | ICD-10-CM

## 2017-05-31 MED ORDER — IBUPROFEN 600 MG PO TABS: 600.0000 mg | ORAL_TABLET | Freq: Four times a day (QID) | ORAL | 0 refills | Status: DC | PRN

## 2017-05-31 NOTE — ED Provider Notes (Signed)
05/31/2017 8:56 PM   DOB: 09-04-1981 / MRN: [id number]  SUBJECTIVE:  Heather [name] is a [age] y.o. female presenting for left sided knee pain that has been somewhat present for about 1 month but was made worse with a small dog jumping into her knee.  Bending makes the pain worse as well as ambulation.  She feels that the pain is getting worse.  She has tried tylenol 500 mg 1 tabs.  She is breast feeding.   She has No Known Allergies.   She  has a past medical history of Carpal tunnel syndrome, left (06/2012); Ganglion cyst (06/2012); Herpes genitalia; and Postpartum care following vaginal delivery (11/6) (09/15/2016).    She  reports that she has never smoked. She has never used smokeless tobacco. She reports that she does not drink alcohol or use drugs. She  reports that she currently engages in sexual activity. She reports using the following method of birth control/protection: None. The patient  has a past surgical history that includes Wisdom tooth extraction and Carpal tunnel release (06/28/2012).  Her family history is not on file.  Review of Systems  Constitutional: Negative for fever.  Respiratory: Negative for cough.   Cardiovascular: Negative for chest pain.  Musculoskeletal: Positive for joint pain. Negative for back pain, falls, myalgias and neck pain.  Skin: Negative for rash.  Neurological: Negative for dizziness.    OBJECTIVE:  BP 126/71 (BP Location: Left Arm)   Pulse 79   Temp 98.9 F (37.2 C) (Oral)   Resp 16   LMP 05/21/2017 (Exact Date)   SpO2 99%   Physical Exam  Constitutional: She appears well-developed and well-nourished.  Cardiovascular: Normal rate.   Pulmonary/Chest: Effort normal.  Musculoskeletal: She exhibits tenderness (medial knee compartment). She exhibits no edema or deformity.       Left knee: No MCL, no LCL and no patellar tendon tenderness noted.       Legs:   No results found for this or any previous visit (from the past 72 hour(s)).  Dg Knee  Complete 4 Views Left  Result Date: 05/31/2017 CLINICAL DATA:  Pain in the left knee EXAM: LEFT KNEE - COMPLETE 4+ VIEW COMPARISON:  None. FINDINGS: Slightly high positioning of the patella. Mild patellofemoral degenerative changes. Mild degenerative changes of the medial compartment. Well corticated bony density adjacent to tibial tuberosity could relate to chronic injury. Cortical surface irregularity on the lateral view at the femoral condyles anteriorly. IMPRESSION: 1. Well corticated bone density at the tibial tuberosity, possibly related to remote or chronic injury 2. Cortical surface irregularity anterior femoral condyle on the lateral view, uncertain if this is related to a cortical fracture or a spurious finding related to positioning. Electronically Signed   By: Jasmine PangKim  Fujinaga M.D.   On: 05/31/2017 20:40     ASSESSMENT AND PLAN:  The encounter diagnosis was Acute pain of left knee. Knee too tender to fully examine today.  Advised 600 mg Ibuprofen every 6 hours as needed for pain.  I will see her back on Friday in my office for recheck.    The patient is advised to call or return to clinic if she does not see an improvement in symptoms, or to seek the care of the closest emergency department if she worsens with the above plan.   Deliah BostonMichael Clark, MHS, PA-C 05/31/2017 8:56 PM    Heather [name], Michael L, PA-C 05/31/17 2057

## 2017-05-31 NOTE — Discharge Instructions (Signed)
I look forward to seeing you this coming Friday.

## 2017-05-31 NOTE — ED Triage Notes (Signed)
[facility] with [facility] of left knee pain x1 week but just today her dog jumped on her and pushed it back now it's swelling and painful.

## 2017-06-05 ENCOUNTER — Ambulatory Visit (INDEPENDENT_AMBULATORY_CARE_PROVIDER_SITE_OTHER): Payer: BLUE CROSS/BLUE SHIELD | Admitting: Physician Assistant

## 2017-06-05 ENCOUNTER — Encounter: Payer: Self-pay | Admitting: Physician Assistant

## 2017-06-05 VITALS — BP 122/76 | HR 68 | Temp 98.9°F | Resp 18 | Ht 68.58 in | Wt 307.4 lb

## 2017-06-05 DIAGNOSIS — M25562 Pain in left knee: Secondary | ICD-10-CM

## 2017-06-05 MED ORDER — IBUPROFEN 600 MG PO TABS: 600.0000 mg | ORAL_TABLET | Freq: Three times a day (TID) | ORAL | 1 refills | Status: DC | PRN

## 2017-06-05 NOTE — Patient Instructions (Signed)
     IF you received an x-ray today, you will receive an invoice from [facility]. Please contact [facility] at [phone] with questions or concerns regarding your invoice.   IF you received labwork today, you will receive an invoice from [facility]. Please contact [facility] at [phone] with questions or concerns regarding your invoice.   Our billing staff will not be able to assist you with questions regarding bills from these companies.  You will be contacted with the lab results as soon as they are available. The fastest way to get your results is to activate your My Chart account. Instructions are located on the last page of this paperwork. If you have not heard from us regarding the results in 2 weeks, please contact this office.     

## 2017-06-05 NOTE — Progress Notes (Signed)
    06/05/2017 2:12 PM   DOB: 1980-12-18 / MRN: [id number]  SUBJECTIVE:  Heather [name] is a 36 y.o. female presenting for left knee pain.  I had seen her in the [name] and advised that she follow up with me in the event that she needs an orthopedic referral. She has been taking NSAIDs and ICE and this has reduced roughly 50% better today. She sits for work. She sees Heather [name] at [name]. She is currently breast feeding.   She has No Known Allergies.   She  has a past medical history of Carpal tunnel syndrome, left (06/2012); Ganglion cyst (06/2012); Herpes genitalia; and Postpartum care following vaginal delivery (11/6) (09/15/2016).    She  reports that she has never smoked. She has never used smokeless tobacco. She reports that she does not drink alcohol or use drugs. She  reports that she currently engages in sexual activity. She reports using the following method of birth control/protection: None. The patient  has a past surgical history that includes Wisdom tooth extraction and Carpal tunnel release (06/28/2012).  Her family history is not on file.  Review of Systems  Constitutional: Negative for chills, diaphoresis and fever.  Respiratory: Negative for cough, hemoptysis, sputum production, shortness of breath and wheezing.   Cardiovascular: Negative for chest pain, orthopnea and leg swelling.  Gastrointestinal: Negative for nausea.  Musculoskeletal: Positive for joint pain.  Skin: Negative for rash.  Neurological: Negative for dizziness.    The problem list and medications were reviewed and updated by myself where necessary and exist elsewhere in the encounter.   OBJECTIVE:  BP 122/76 (BP Location: Right Arm, Patient Position: Sitting, Cuff Size: Large)   Pulse 68   Temp 98.9 F (37.2 C) (Oral)   Resp 18   Ht 5' 8.58" (1.742 m)   Wt (!) 307 lb 6.4 oz (139.4 kg)   LMP 05/21/2017 (Exact Date)   SpO2 98%   Breastfeeding? Yes   BMI 45.95 kg/m   Physical Exam    Constitutional: She is active.  Non-toxic appearance.  Cardiovascular: Normal rate.   Pulmonary/Chest: Effort normal. No tachypnea.  Musculoskeletal: Normal range of motion. She exhibits no edema, tenderness or deformity.  Neurological: She is alert.  Skin: Skin is warm and dry. She is not diaphoretic. No pallor.    No results found for this or any previous visit (from the past 72 hour(s)).  No results found.  ASSESSMENT AND PLAN:  Heather [name] was seen today for joint swelling.  Diagnoses and all orders for this visit:  Acute pain of left knee: Drasically improved exam from previous.  Advised we hold off on imaging at this time. Will continue Ibuprofen for another week and get her to PT for further eval and strengthening.  She is amenable to this idea. RTC in two months for annual exam.   -     Ambulatory referral to Physical Therapy  Other orders -     ibuprofen (ADVIL,MOTRIN) 600 MG tablet; Take 1 tablet (600 mg total) by mouth every 8 (eight) hours as needed.    The patient is advised to call or return to clinic if she does not see an improvement in symptoms, or to seek the care of the closest emergency department if she worsens with the above plan.   Heather [name], MHS, PA-C Primary Care at [name] 06/05/2017 2:12 PM

## 2017-06-24 ENCOUNTER — Telehealth (HOSPITAL_COMMUNITY): Payer: Self-pay | Admitting: Family Medicine

## 2017-06-24 ENCOUNTER — Other Ambulatory Visit: Payer: Self-pay | Admitting: Family Medicine

## 2017-06-24 DIAGNOSIS — I209 Angina pectoris, unspecified: Secondary | ICD-10-CM

## 2017-06-26 NOTE — Telephone Encounter (Signed)
User: Trina Ao A Date/time: 06/24/17 1:12 PM  Comment: Called pt and lmsg for her to CB to sch an echo in addtion to the stress echo that is currently scheduled.   Context:  Outcome: Left Message  Phone number: [phone] Phone Type: Home Phone  Comm. type: Telephone Call type: Outgoing  Contact: [place], [place] Relation to patient: Self

## 2017-06-29 ENCOUNTER — Other Ambulatory Visit (HOSPITAL_COMMUNITY): Payer: Self-pay

## 2017-07-02 ENCOUNTER — Other Ambulatory Visit: Payer: Self-pay

## 2017-07-02 ENCOUNTER — Ambulatory Visit (HOSPITAL_COMMUNITY): Payer: BLUE CROSS/BLUE SHIELD | Attending: Internal Medicine

## 2017-07-02 DIAGNOSIS — I209 Angina pectoris, unspecified: Secondary | ICD-10-CM

## 2017-07-02 DIAGNOSIS — I517 Cardiomegaly: Secondary | ICD-10-CM | POA: Insufficient documentation

## 2017-07-02 DIAGNOSIS — I361 Nonrheumatic tricuspid (valve) insufficiency: Secondary | ICD-10-CM | POA: Insufficient documentation

## 2017-07-15 ENCOUNTER — Other Ambulatory Visit (HOSPITAL_COMMUNITY): Payer: Self-pay

## 2017-07-23 ENCOUNTER — Ambulatory Visit (HOSPITAL_COMMUNITY)
Admission: EM | Admit: 2017-07-23 | Discharge: 2017-07-23 | Disposition: A | Discharge status: Home / Self Care | Payer: BLUE CROSS/BLUE SHIELD | Attending: Family Medicine | Admitting: Family Medicine

## 2017-07-23 ENCOUNTER — Encounter (HOSPITAL_COMMUNITY): Payer: Self-pay | Admitting: Family Medicine

## 2017-07-23 DIAGNOSIS — R0789 Other chest pain: Secondary | ICD-10-CM

## 2017-07-23 DIAGNOSIS — S29019A Strain of muscle and tendon of unspecified wall of thorax, initial encounter: Secondary | ICD-10-CM | POA: Diagnosis not present

## 2017-07-23 NOTE — ED Notes (Signed)
Patient verbalized understanding of discharge instructions and denies any further needs or questions at this time. VS stable. Patient ambulatory with steady gait.  

## 2017-07-23 NOTE — ED Triage Notes (Signed)
Pt here for chest pain that started today across chest and into back. Reports nausea and slight SOB. Worse with movement and deep breathing.

## 2017-07-23 NOTE — Discharge Instructions (Signed)
Chest wall pain is often an inflammatory condition of the ribs and associated muscles and cartilage. Since you are breast-feeding it is probably better not to take anti-inflammatory medicines. However, applying ice to the areas of soreness can help reduce inflammation and discomfort. The pain in the back is due to a muscle strain. Make sure you use good posture. No heavy lifting or bending and apply heat to this area. He may take Tylenol. For any worsening, new symptoms or problems may return or if necessary to go to the emergency department or follow-up with your primary care doctor.

## 2017-07-23 NOTE — ED Provider Notes (Signed)
[facility]    CSN: [id number] Arrival date & time: 07/23/17  1654     History   Chief Complaint Chief Complaint  Patient presents with  . Chest Pain    HPI Heather [name] is a 36 y.o. female.   Morbidly obese 36 year old female states that today she is had chest pain across the upper chest as well as pain to the left mid thoracic area of the back. The upper chest pain is exacerbated or elicited by movements, taking a deep breath. The pain in the back is well localized, no radiation is elicited with movement and twisting of the torso. She is also concerned because yesterday she had a headache and today although better, a dull ache. The day before that she had nausea. She is concerned because she has enlarged heart. Currently breast-feeding.      Past Medical History:  Diagnosis Date  . Carpal tunnel syndrome, left 06/2012  . Ganglion cyst 06/2012   left volar  . Herpes genitalia   . Postpartum care following vaginal delivery (11/6) 09/15/2016    Patient Active Problem List   Diagnosis Date Noted  . Right carpal tunnel syndrome 03/06/2017  . Left carpal tunnel syndrome 03/06/2017  . Postpartum care following vaginal delivery (11/6) 09/15/2016    Past Surgical History:  Procedure Laterality Date  . CARPAL TUNNEL RELEASE  06/28/2012   Procedure: CARPAL TUNNEL RELEASE;  Surgeon: Tami RibasKevin R Kuzma, MD;  Location: [facility];  Service: Orthopedics;  Laterality: Left;  EXCISION VOLAR GANGLION CYST and carpal tunnel release left timeouts for ganglion cyst done at same time as timeouts for carpal tunnel with same staffs  . WISDOM TOOTH EXTRACTION      OB History    Gravida Para Term Preterm AB Living   4 2 2   2 2    SAB TAB Ectopic Multiple Live Births   1 1   0 1       Home Medications    Prior to Admission medications   Medication Sig Start Date End Date Taking? Authorizing Provider  acetaminophen (TYLENOL) 325 MG tablet Take 650 mg by mouth  every 6 (six) hours as needed.    [provider]  ibuprofen (ADVIL,MOTRIN) 600 MG tablet Take 1 tablet (600 mg total) by mouth every 8 (eight) hours as needed. 06/05/17   Ofilia Neaslark, Michael L, PA-C  Prenatal Vit-Fe Fumarate-FA (PRENATAL MULTIVITAMIN) TABS tablet Take 1 tablet by mouth daily at 12 noon.    [provider]  valACYclovir (VALTREX) 500 MG tablet Take 500 mg by mouth 2 (two) times daily as needed. For outbreak    [provider]    Family History History reviewed. No pertinent family history.  Social History Social History  Substance Use Topics  . Smoking status: Never Smoker  . Smokeless tobacco: Never Used  . Alcohol use No     Allergies   Patient has no known allergies.   Review of Systems Review of Systems  Constitutional: Negative.   Respiratory: Negative.   Cardiovascular: Positive for chest pain.  Gastrointestinal: Positive for nausea.  Genitourinary: Negative.   Musculoskeletal: Positive for back pain.  Neurological: Positive for headaches. Negative for dizziness and syncope.  Psychiatric/Behavioral: Negative.   All other systems reviewed and are negative.    Physical Exam Triage Vital Signs ED Triage Vitals  Enc Vitals Group     BP 07/23/17 1736 128/72     Pulse Rate 07/23/17 1736 71  Resp 07/23/17 1736 18     Temp 07/23/17 1736 98.7 F (37.1 C)     Temp src --      SpO2 07/23/17 1736 99 %     Weight --      Height --      Head Circumference --      Peak Flow --      Pain Score 07/23/17 1734 2     Pain Loc --      Pain Edu? --      Excl. in GC? --    No data found.   Updated Vital Signs BP 128/72   Pulse 71   Temp 98.7 F (37.1 C)   Resp 18   LMP 07/16/2017   SpO2 99%   Visual Acuity Right Eye Distance:   Left Eye Distance:   Bilateral Distance:    Right Eye Near:   Left Eye Near:    Bilateral Near:     Physical Exam  Constitutional: She is oriented to person, place, and time. She appears  well-developed and well-nourished. No distress.  HENT:  Head: Normocephalic and atraumatic.  Eyes: EOM are normal.  Neck: Normal range of motion. Neck supple.  Cardiovascular: Normal rate, regular rhythm, normal heart sounds and intact distal pulses.   Pulmonary/Chest: Effort normal and breath sounds normal. No respiratory distress. She has no wheezes. She has no rales. She exhibits tenderness.  Chest wall tender across the upper chest and upper parasternal borders.  Musculoskeletal: She exhibits no edema.  Local tenderness to the mid thoracic left paraspinal musculature. Deep palpation produces tenderness and when bandage removed the tenderness abates. Pain is reproduced on having her bend forward and turned/twist the torso left and right.  Neurological: She is alert and oriented to person, place, and time. No cranial nerve deficit.  Skin: Skin is warm and dry.  Psychiatric: She has a normal mood and affect.  Nursing note and vitals reviewed.    UC Treatments / Results  Labs (all labs ordered are listed, but only abnormal results are displayed) Labs Reviewed - No data to display  EKG  EKG Interpretation None     ED ECG REPORT   Date: 07/23/2017  Rate: 56  Rhythm: sinus bradycardia  QRS Axis: normal  Intervals: normal  ST/T Wave abnormalities: normal  Conduction Disutrbances:none  Narrative Interpretation:   Old EKG Reviewed: none available  I have personally reviewed the EKG tracing and agree with the computerized printout as noted.   Radiology No results found.  Procedures Procedures (including critical care time)  Medications Ordered in UC Medications - No data to display   Initial Impression / Assessment and Plan / UC Course  I have reviewed the triage vital signs and the nursing notes.  Pertinent labs & imaging results that were available during my care of the patient were reviewed by me and considered in my medical decision making (see chart for  details).     Chest wall pain is often an inflammatory condition of the ribs and associated muscles and cartilage. Since you are breast-feeding it is probably better not to take anti-inflammatory medicines. However, applying ice to the areas of soreness can help reduce inflammation and discomfort. The pain in the back is due to a muscle strain. Make sure you use good posture. No heavy lifting or bending and apply heat to this area. He may take Tylenol. For any worsening, new symptoms or problems may return or if necessary to go to the  emergency department or follow-up with your primary care doctor.   Final Clinical Impressions(s) / UC Diagnoses   Final diagnoses:  Chest wall pain  Acute thoracic myofascial strain, initial encounter    New Prescriptions Current Discharge Medication List       Controlled Substance Prescriptions [place] Controlled Substance Registry consulted? Not Applicable   Hayden Rasmussen, NP 07/23/17 [phone]

## 2017-08-10 ENCOUNTER — Encounter: Payer: Self-pay | Admitting: Physician Assistant

## 2017-08-10 ENCOUNTER — Ambulatory Visit (INDEPENDENT_AMBULATORY_CARE_PROVIDER_SITE_OTHER): Payer: BLUE CROSS/BLUE SHIELD | Admitting: Physician Assistant

## 2017-08-10 VITALS — BP 124/84 | HR 75 | Temp 98.3°F | Resp 18 | Ht 69.0 in | Wt 310.2 lb

## 2017-08-10 DIAGNOSIS — Z130 Encounter for screening for diseases of the blood and blood-forming organs and certain disorders involving the immune mechanism: Secondary | ICD-10-CM

## 2017-08-10 DIAGNOSIS — Z803 Family history of malignant neoplasm of breast: Secondary | ICD-10-CM

## 2017-08-10 DIAGNOSIS — Z1329 Encounter for screening for other suspected endocrine disorder: Secondary | ICD-10-CM

## 2017-08-10 DIAGNOSIS — Z13228 Encounter for screening for other metabolic disorders: Secondary | ICD-10-CM

## 2017-08-10 DIAGNOSIS — E559 Vitamin D deficiency, unspecified: Secondary | ICD-10-CM

## 2017-08-10 DIAGNOSIS — Z0000 Encounter for general adult medical examination without abnormal findings: Secondary | ICD-10-CM | POA: Diagnosis not present

## 2017-08-10 DIAGNOSIS — Z1321 Encounter for screening for nutritional disorder: Secondary | ICD-10-CM

## 2017-08-10 NOTE — Patient Instructions (Signed)
     IF you received an x-ray today, you will receive an invoice from [facility]. Please contact [facility] at [phone] with questions or concerns regarding your invoice.   IF you received labwork today, you will receive an invoice from [facility]. Please contact [facility] at [phone] with questions or concerns regarding your invoice.   Our billing staff will not be able to assist you with questions regarding bills from these companies.  You will be contacted with the lab results as soon as they are available. The fastest way to get your results is to activate your My Chart account. Instructions are located on the last page of this paperwork. If you have not heard from us regarding the results in 2 weeks, please contact this office.     

## 2017-08-10 NOTE — Progress Notes (Signed)
08/11/2017 1:51 PM   DOB: [birth year] / MRN: [id number]  SUBJECTIVE:  Heather [name] is a 36 y.o. female presenting for annual exam.  Tells me that she had a PAP smear about 1 year ago and this was normal. She is getting a stress ECHO 2/2 to LVH noted on previous echo. Tells me that her CHF was due to anemia. She feels well today.  Has her 35 year old with her today. She has a strong family history of breast cancer in her mother and uncle.   She has No Known Allergies.   She  has a past medical history of Anemia; Carpal tunnel syndrome, left (06/2012); Ganglion cyst (06/2012); Herpes genitalia; and Postpartum care following vaginal delivery (11/6) (09/15/2016).    She  reports that she has never smoked. She has never used smokeless tobacco. She reports that she does not drink alcohol or use drugs. She  reports that she currently engages in sexual activity. She reports using the following method of birth control/protection: None. The patient  has a past surgical history that includes Wisdom tooth extraction and Carpal tunnel release (06/28/2012).  Her family history includes Hyperlipidemia in her father; Hypertension in her father and mother.  Review of Systems  Constitutional: Negative for chills, diaphoresis and fever.  Eyes: Negative.   Respiratory: Negative for cough, hemoptysis, sputum production, shortness of breath and wheezing.   Cardiovascular: Negative for chest pain, orthopnea and leg swelling.  Gastrointestinal: Negative for abdominal pain, blood in stool, constipation, diarrhea, heartburn, melena, nausea and vomiting.  Genitourinary: Negative for flank pain.  Skin: Negative for rash.  Neurological: Negative for dizziness, sensory change, speech change, focal weakness and headaches.    The problem list and medications were reviewed and updated by myself where necessary and exist elsewhere in the encounter.   OBJECTIVE:  BP 124/84   Pulse 75   Temp 98.3 F (36.8 C) (Oral)    Resp 18   Ht  (1.753 m)   Wt (!) 310 lb 3.2 oz (140.7 kg)   LMP 07/16/2017   SpO2 97%   BMI 45.81 kg/m   Wt Readings from Last 3 Encounters:  08/10/17 (!) 310 lb 3.2 oz (140.7 kg)  06/05/17 (!) 307 lb 6.4 oz (139.4 kg)  09/15/16 (!) 308 lb (139.7 kg)   Lab Results  Component Value Date   WBC 5.2 08/10/2017   HGB 12.1 08/10/2017   HCT 36.6 08/10/2017   MCV 88 08/10/2017   PLT 280 08/10/2017      Physical Exam  Constitutional: She is oriented to person, place, and time. She is active.  Non-toxic appearance.  Eyes: Pupils are equal, round, and reactive to light. EOM are normal.  Cardiovascular: Normal rate, regular rhythm, S1 normal, S2 normal, normal heart sounds and intact distal pulses.  Exam reveals no gallop, no friction rub and no decreased pulses.   No murmur heard. Pulmonary/Chest: Effort normal. No stridor. No tachypnea. No respiratory distress. She has no wheezes. She has no rales.  Abdominal: She exhibits no distension.  Musculoskeletal: She exhibits no edema.  Neurological: She is alert and oriented to person, place, and time. She has normal strength and normal reflexes. She is not disoriented. She displays no atrophy. No cranial nerve deficit or sensory deficit. She exhibits normal muscle tone. Coordination and gait normal.  Skin: Skin is warm and dry. She is not diaphoretic. No pallor.  Psychiatric: Her behavior is normal.      Results for orders  placed or performed in visit on 08/10/17 (from the past 72 hour(s))  BRCAssure Comprehensive Test     Status: None (Preliminary result)   Collection Time: 08/10/17 11:29 AM  Result Value Ref Range   PREAUTHORIZATION WILL FOLLOW    EXTRACTION WILL FOLLOW   CBC     Status: None   Collection Time: 08/10/17 11:29 AM  Result Value Ref Range   WBC 5.2 3.4 - 10.8 x10E3/uL   RBC 4.15 3.77 - 5.28 x10E6/uL   Hemoglobin 12.1 11.1 - 15.9 g/dL   Hematocrit 16.1 09.6 - 46.6 %   MCV 88 79 - 97 fL   MCH 29.2 26.6 - 33.0  pg   MCHC 33.1 31.5 - 35.7 g/dL   RDW 04.5 40.9 - 81.1 %   Platelets 280 150 - 379 x10E3/uL  Lipid panel     Status: None   Collection Time: 08/10/17 11:29 AM  Result Value Ref Range   Cholesterol, Total 155 100 - 199 mg/dL   Triglycerides 46 0 - 149 mg/dL   HDL 56 >91 mg/dL   VLDL Cholesterol Cal 9 5 - 40 mg/dL   LDL Calculated 90 0 - 99 mg/dL   Chol/HDL Ratio 2.8 0.0 - 4.4 ratio    Comment:                                   T. Chol/HDL Ratio                                             Men  Women                               1/2 Avg.Risk  3.4    3.3                                   Avg.Risk  5.0    4.4                                2X Avg.Risk  9.6    7.1                                3X Avg.Risk 23.4   11.0   TSH     Status: None   Collection Time: 08/10/17 11:29 AM  Result Value Ref Range   TSH 1.010 0.450 - 4.500 uIU/mL  Hemoglobin A1c     Status: Abnormal   Collection Time: 08/10/17 11:29 AM  Result Value Ref Range   Hgb A1c MFr Bld 5.7 (H) 4.8 - 5.6 %    Comment:          Prediabetes: 5.7 - 6.4          Diabetes: >6.4          Glycemic control for adults with diabetes: <7.0    Est. average glucose Bld gHb Est-mCnc 117 mg/dL  Basic metabolic panel     Status: Abnormal   Collection Time: 08/10/17 11:29 AM  Result Value Ref Range   Glucose 89 65 -  99 mg/dL   BUN 13 6 - 20 mg/dL   Creatinine, Ser 1.61 0.57 - 1.00 mg/dL   GFR calc non Af Amer 87 >59 mL/min/1.73   GFR calc Af Amer 100 >59 mL/min/1.73   BUN/Creatinine Ratio 15 9 - 23   Sodium 139 134 - 144 mmol/L   Potassium 4.3 3.5 - 5.2 mmol/L   Chloride 107 (H) 96 - 106 mmol/L   CO2 24 20 - 29 mmol/L   Calcium 9.6 8.7 - 10.2 mg/dL  Hepatic function panel     Status: None   Collection Time: 08/10/17 11:29 AM  Result Value Ref Range   Total Protein 7.5 6.0 - 8.5 g/dL   Albumin 4.0 3.5 - 5.5 g/dL   Bilirubin Total 0.3 0.0 - 1.2 mg/dL   Bilirubin, Direct 0.96 0.00 - 0.40 mg/dL   Alkaline Phosphatase 95 39 - 117  IU/L   AST 15 0 - 40 IU/L   ALT 11 0 - 32 IU/L  Hepatitis B surface antibody     Status: Abnormal   Collection Time: 08/10/17 11:29 AM  Result Value Ref Range   Hepatitis B Surf Ab Quant 3.5 (L) Immunity>9.9 mIU/mL    Comment:   Status of Immunity                     Anti-HBs Level   ------------------                     -------------- Inconsistent with Immunity                   0.0 - 9.9 Consistent with Immunity                          >9.9   Hepatitis B surface antigen     Status: None   Collection Time: 08/10/17 11:29 AM  Result Value Ref Range   Hepatitis B Surface Ag Negative Negative  Iron     Status: None   Collection Time: 08/10/17 11:29 AM  Result Value Ref Range   Iron 86 27 - 159 ug/dL  Vitamin D, 04-[id number]     Status: Abnormal   Collection Time: 08/10/17 11:29 AM  Result Value Ref Range   Vit D, 25-Hydroxy 17.0 (L) 30.0 - 100.0 ng/mL    Comment: Vitamin D deficiency has been defined by the Institute of Medicine and an Endocrine Society practice guideline as a level of serum 25-OH vitamin D less than 20 ng/mL (1,2). The Endocrine Society went on to further define vitamin D insufficiency as a level between 21 and 29 ng/mL (2). 1. IOM (Institute of Medicine). 2010. Dietary reference    intakes for calcium and D. [place]: The    [employer]. 2. Holick MF, Binkley [place], Bischoff-Ferrari HA, et al.    Evaluation, treatment, and prevention of vitamin D    deficiency: an Endocrine Society clinical practice    guideline. JCEM. 2011 Jul; 96(7):1911-30.     No results found.  ASSESSMENT AND PLAN:  Shakeema was seen today for annual exam.  Diagnoses and all orders for this visit:  Annual physical exam: She wants to be screened now to help determine her risk for breast cancer so she can make informed deciesions about her GYN health.  Mammogram pending.   Family history of breast cancer -     BRCAssure Comprehensive Test  Screening for  endocrine, nutritional, metabolic  and immunity disorder -     BRCAssure Comprehensive Test -     MM DIGITAL SCREENING BILATERAL; Future -     CBC -     Lipid panel -     TSH -     Hemoglobin A1c -     Basic metabolic panel -     Hepatic function panel -     Hepatitis B surface antibody -     Hepatitis B surface antigen -     Iron -     Vitamin D, 25-hydroxy    The patient is advised to call or return to clinic if she does not see an improvement in symptoms, or to seek the care of the closest emergency department if she worsens with the above plan.   Deliah Boston, MHS, PA-C Primary Care at [facility] 08/11/2017 1:51 PM

## 2017-08-11 DIAGNOSIS — E559 Vitamin D deficiency, unspecified: Secondary | ICD-10-CM | POA: Insufficient documentation

## 2017-08-11 MED ORDER — VITAMIN D (ERGOCALCIFEROL) 1.25 MG (50000 UNIT) PO CAPS: 50000.0000 [IU] | ORAL_CAPSULE | ORAL | 0 refills | Status: DC

## 2017-08-13 ENCOUNTER — Telehealth (HOSPITAL_COMMUNITY): Payer: Self-pay | Admitting: *Deleted

## 2017-08-13 NOTE — Telephone Encounter (Signed)
Patient given detailed instructions per Stress Test Requisition Sheet for test on 08/17/17 at 7:30.Patient Notified to arrive 30 minutes early, and that it is imperative to arrive on time for appointment to keep from having the test rescheduled.  Patient verbalized understanding. Daneil Dolin

## 2017-08-17 ENCOUNTER — Other Ambulatory Visit (HOSPITAL_COMMUNITY): Payer: BLUE CROSS/BLUE SHIELD

## 2017-08-24 ENCOUNTER — Telehealth: Payer: Self-pay | Admitting: Physician Assistant

## 2017-08-24 NOTE — Telephone Encounter (Signed)
[facility] called asking about a fax that Heather [name] needs to sign off on.  They have faxed it twice.  They left a message on voicemail with referrals for some reason.  They did not specify what this is about.  There call back number is [phone]

## 2017-08-26 NOTE — Telephone Encounter (Signed)
Received forms, faxed at this time./ S.Shuntell Foody,CMA

## 2017-09-02 LAB — BRCASSURE COMPREHENSIVE TEST

## 2017-09-02 LAB — BASIC METABOLIC PANEL
BUN/Creatinine Ratio: 15 (ref 9–23)
BUN: 13 mg/dL (ref 6–20)
CO2: 24 mmol/L (ref 20–29)
CREATININE: 0.87 mg/dL (ref 0.57–1.00)
Calcium: 9.6 mg/dL (ref 8.7–10.2)
Chloride: 107 mmol/L — ABNORMAL HIGH (ref 96–106)
GFR, EST AFRICAN AMERICAN: 100 mL/min/{1.73_m2} (ref >59)
GFR, EST NON AFRICAN AMERICAN: 87 mL/min/{1.73_m2} (ref >59)
Glucose: 89 mg/dL (ref 65–99)
POTASSIUM: 4.3 mmol/L (ref 3.5–5.2)
SODIUM: 139 mmol/L (ref 134–144)

## 2017-09-02 LAB — COMPREHENSIVE BRCA1/2 ANALYSIS

## 2017-09-02 LAB — HEPATITIS B SURFACE ANTIBODY, QUANTITATIVE: Hepatitis B Surf Ab Quant: 3.5 m[IU]/mL — ABNORMAL LOW (ref >9.9)

## 2017-09-02 LAB — HEPATIC FUNCTION PANEL
ALK PHOS: 95 IU/L (ref 39–117)
ALT: 11 IU/L (ref 0–32)
AST: 15 IU/L (ref 0–40)
Albumin: 4 g/dL (ref 3.5–5.5)
BILIRUBIN TOTAL: 0.3 mg/dL (ref 0.0–1.2)
Bilirubin, Direct: 0.1 mg/dL (ref 0.00–0.40)
Total Protein: 7.5 g/dL (ref 6.0–8.5)

## 2017-09-02 LAB — TSH: TSH: 1.01 u[IU]/mL (ref 0.450–4.500)

## 2017-09-02 LAB — CBC
Hematocrit: 36.6 % (ref 34.0–46.6)
Hemoglobin: 12.1 g/dL (ref 11.1–15.9)
MCH: 29.2 pg (ref 26.6–33.0)
MCHC: 33.1 g/dL (ref 31.5–35.7)
MCV: 88 fL (ref 79–97)
PLATELETS: 280 10*3/uL (ref 150–379)
RBC: 4.15 x10E6/uL (ref 3.77–5.28)
RDW: 13.3 % (ref 12.3–15.4)
WBC: 5.2 10*3/uL (ref 3.4–10.8)

## 2017-09-02 LAB — VITAMIN D 25 HYDROXY (VIT D DEFICIENCY, FRACTURES): Vit D, 25-Hydroxy: 17 ng/mL — ABNORMAL LOW (ref 30.0–100.0)

## 2017-09-02 LAB — LIPID PANEL
CHOL/HDL RATIO: 2.8 ratio (ref 0.0–4.4)
Cholesterol, Total: 155 mg/dL (ref 100–199)
HDL: 56 mg/dL (ref >39)
LDL CALC: 90 mg/dL (ref 0–99)
Triglycerides: 46 mg/dL (ref 0–149)
VLDL CHOLESTEROL CAL: 9 mg/dL (ref 5–40)

## 2017-09-02 LAB — HEMOGLOBIN A1C
Est. average glucose Bld gHb Est-mCnc: 117 mg/dL
HEMOGLOBIN A1C: 5.7 % — AB (ref 4.8–5.6)

## 2017-09-02 LAB — IRON: IRON: 86 ug/dL (ref 27–159)

## 2017-09-02 LAB — HEPATITIS B SURFACE ANTIGEN: HEP B S AG: NEGATIVE

## 2017-09-03 ENCOUNTER — Telehealth (HOSPITAL_COMMUNITY): Payer: Self-pay | Admitting: *Deleted

## 2017-09-03 NOTE — Telephone Encounter (Signed)
Patient given detailed instructions per Stress Test Requisition Sheet for test on 09/10/17 at 7:30.Patient Notified to arrive 30 minutes early, and that it is imperative to arrive on time for appointment to keep from having the test rescheduled.  Patient verbalized understanding. Heather DolinSharon S Brooks

## 2017-09-10 ENCOUNTER — Encounter (INDEPENDENT_AMBULATORY_CARE_PROVIDER_SITE_OTHER): Payer: Self-pay

## 2017-09-10 ENCOUNTER — Ambulatory Visit (HOSPITAL_COMMUNITY): Payer: BLUE CROSS/BLUE SHIELD

## 2017-09-10 ENCOUNTER — Ambulatory Visit (HOSPITAL_COMMUNITY): Payer: BLUE CROSS/BLUE SHIELD | Attending: Cardiovascular Disease

## 2017-09-10 DIAGNOSIS — I209 Angina pectoris, unspecified: Secondary | ICD-10-CM | POA: Insufficient documentation

## 2017-09-23 ENCOUNTER — Other Ambulatory Visit (HOSPITAL_COMMUNITY): Payer: Self-pay

## 2017-10-06 ENCOUNTER — Ambulatory Visit: Payer: BLUE CROSS/BLUE SHIELD

## 2017-10-22 ENCOUNTER — Ambulatory Visit
Admission: RE | Admit: 2017-10-22 | Discharge: 2017-10-22 | Disposition: A | Discharge status: Home / Self Care | Payer: BLUE CROSS/BLUE SHIELD | Source: Ambulatory Visit | Attending: Physician Assistant | Admitting: Physician Assistant

## 2017-10-22 DIAGNOSIS — Z1321 Encounter for screening for nutritional disorder: Principal | ICD-10-CM

## 2017-10-22 DIAGNOSIS — Z1329 Encounter for screening for other suspected endocrine disorder: Principal | ICD-10-CM

## 2017-10-22 DIAGNOSIS — Z130 Encounter for screening for diseases of the blood and blood-forming organs and certain disorders involving the immune mechanism: Secondary | ICD-10-CM

## 2017-10-22 DIAGNOSIS — Z13228 Encounter for screening for other metabolic disorders: Principal | ICD-10-CM

## 2018-03-06 ENCOUNTER — Encounter: Payer: Self-pay | Admitting: Family Medicine

## 2018-03-06 ENCOUNTER — Ambulatory Visit: Payer: BLUE CROSS/BLUE SHIELD | Admitting: Family Medicine

## 2018-03-06 ENCOUNTER — Other Ambulatory Visit: Payer: Self-pay

## 2018-03-06 VITALS — BP 110/78 | HR 84 | Temp 98.4°F | Resp 16 | Ht 69.0 in | Wt 300.6 lb

## 2018-03-06 DIAGNOSIS — E559 Vitamin D deficiency, unspecified: Secondary | ICD-10-CM | POA: Diagnosis not present

## 2018-03-06 DIAGNOSIS — R7303 Prediabetes: Secondary | ICD-10-CM

## 2018-03-06 NOTE — Patient Instructions (Addendum)
   IF you received an x-ray today, you will receive an invoice from [facility]. Please contact  Radiology at [phone] with questions or concerns regarding your invoice.   IF you received labwork today, you will receive an invoice from [facility]. Please contact [facility] at [phone] with questions or concerns regarding your invoice.   Our billing staff will not be able to assist you with questions regarding bills from these companies.  You will be contacted with the lab results as soon as they are available. The fastest way to get your results is to activate your My Chart account. Instructions are located on the last page of this paperwork. If you have not heard from us regarding the results in 2 weeks, please contact this office.     Vitamin D Deficiency Vitamin D deficiency is when your body does not have enough vitamin D. Vitamin D is important to your body for many reasons:  It helps the body to absorb two important minerals, called calcium and phosphorus.  It plays a role in bone health.  It may help to prevent some diseases, such as diabetes and multiple sclerosis.  It plays a role in muscle function, including heart function.  You can get vitamin D by:  Eating foods that naturally contain vitamin D.  Eating or drinking milk or other dairy products that have vitamin D added to them.  Taking a vitamin D supplement or a multivitamin supplement that contains vitamin D.  Being in the sun. Your body naturally makes vitamin D when your skin is exposed to sunlight. Your body changes the sunlight into a form of the vitamin that the body can use.  If vitamin D deficiency is severe, it can cause a condition in which your bones become soft. In adults, this condition is called osteomalacia. In children, this condition is called rickets. What are the causes? Vitamin D deficiency may be caused by:  Not eating enough foods that contain vitamin D.  Not getting  enough sun exposure.  Having certain digestive system diseases that make it difficult for your body to absorb vitamin D. These diseases include Crohn disease, chronic pancreatitis, and cystic fibrosis.  Having a surgery in which a part of the stomach or a part of the small intestine is removed.  Being obese.  Having chronic kidney disease or liver disease.  What increases the risk? This condition is more likely to develop in:  Older people.  People who do not spend much time outdoors.  People who live in a long-term care facility.  People who have had broken bones.  People with weak or thin bones (osteoporosis).  People who have a disease or condition that changes how the body absorbs vitamin D.  People who have dark skin.  People who take certain medicines, such as steroid medicines or certain seizure medicines.  People who are overweight or obese.  What are the signs or symptoms? In mild cases of vitamin D deficiency, there may not be any symptoms. If the condition is severe, symptoms may include:  Bone pain.  Muscle pain.  Falling often.  Broken bones caused by a minor injury.  How is this diagnosed? This condition is usually diagnosed with a blood test. How is this treated? Treatment for this condition may depend on what caused the condition. Treatment options include:  Taking vitamin D supplements.  Taking a calcium supplement. Your health care provider will suggest what dose is best for you.  Follow these instructions at   home:  Take medicines and supplements only as told by your health care provider.  Eat foods that contain vitamin D. Choices include: ? Fortified dairy products, cereals, or juices. Fortified means that vitamin D has been added to the food. Check the label on the package to be sure. ? Fatty fish, such as salmon or trout. ? Eggs. ? Oysters.  Do not use a tanning bed.  Maintain a healthy weight. Lose weight, if needed.  Keep all  follow-up visits as told by your health care provider. This is important. Contact a health care provider if:  Your symptoms do not go away.  You feel like throwing up (nausea) or you throw up (vomit).  You have fewer bowel movements than usual or it is difficult for you to have a bowel movement (constipation). This information is not intended to replace advice given to you by your health care provider. Make sure you discuss any questions you have with your health care provider. Document Released: 01/19/2012 Document Revised: 04/09/2016 Document Reviewed: 03/14/2015 Elsevier Interactive Patient Education  2018 Elsevier Inc.  

## 2018-03-06 NOTE — Progress Notes (Signed)
Chief Complaint  Patient presents with  . Headache    after eating feels sluggish, have headache wants to recheck numbers     HPI   Prediabetes  Patient reports that she has been feeling  She does not exercise She states that she feels sluggish Body mass index is 44.39 kg/m. Wt Readings from Last 3 Encounters:  03/06/18 (!) 300 lb 9.6 oz (136.4 kg)  08/10/17 (!) 310 lb 3.2 oz (140.7 kg)  06/05/17 (!) 307 lb 6.4 oz (139.4 kg)    She has not eaten anything today For dinner she had scrambled, eggs, with cheese and avocado, potatoes and pancakes She drinks about 100 oz of water daily  Lab Results  Component Value Date   HGBA1C 5.7 (H) 08/10/2017     Past Medical History:  Diagnosis Date  . Anemia   . Carpal tunnel syndrome, left 06/2012  . Ganglion cyst 06/2012   left volar  . Herpes genitalia   . Postpartum care following vaginal delivery (11/6) 09/15/2016    Current Outpatient Medications  Medication Sig Dispense Refill  . Vitamin D, Ergocalciferol, (DRISDOL) 50000 units CAPS capsule Take 1 capsule (50,000 Units total) by mouth every 7 (seven) days. (Patient not taking: Reported on 03/06/2018) 14 capsule 0   No current facility-administered medications for this visit.     Allergies: No Known Allergies  Past Surgical History:  Procedure Laterality Date  . CARPAL TUNNEL RELEASE  06/28/2012   Procedure: CARPAL TUNNEL RELEASE;  Surgeon: Tami Ribas, MD;  Location: [facility];  Service: Orthopedics;  Laterality: Left;  EXCISION VOLAR GANGLION CYST and carpal tunnel release left timeouts for ganglion cyst done at same time as timeouts for carpal tunnel with same staffs  . WISDOM TOOTH EXTRACTION      Social History   Socioeconomic History  . Marital status: Married    Spouse name: Not on file  . Number of children: Not on file  . Years of education: Not on file  . Highest education level: Not on file  Occupational History  . Not on file  Social  Needs  . Financial resource strain: Not on file  . Food insecurity:    Worry: Not on file    Inability: Not on file  . Transportation needs:    Medical: Not on file    Non-medical: Not on file  Tobacco Use  . Smoking status: Never Smoker  . Smokeless tobacco: Never Used  Substance and Sexual Activity  . Alcohol use: No  . Drug use: No  . Sexual activity: Yes    Birth control/protection: None  Lifestyle  . Physical activity:    Days per week: Not on file    Minutes per session: Not on file  . Stress: Not on file  Relationships  . Social connections:    Talks on phone: Not on file    Gets together: Not on file    Attends religious service: Not on file    Active member of club or organization: Not on file    Attends meetings of clubs or organizations: Not on file    Relationship status: Not on file  Other Topics Concern  . Not on file  Social History Narrative  . Not on file    Family History  Problem Relation Age of Onset  . Hypertension Mother   . Hypertension Father   . Hyperlipidemia Father   . Breast cancer Maternal Aunt  pt unsure  . Breast cancer Maternal Uncle   . Breast cancer Maternal Aunt        pt unsure     ROS Review of Systems See HPI Constitution: No fevers or chills No malaise No diaphoresis Skin: No rash or itching Eyes: no blurry vision, no double vision GU: no dysuria or hematuria Neuro: no dizziness or headaches all others reviewed and negative   Objective: Vitals:   03/06/18 1341  BP: 110/78  Pulse: 84  Resp: 16  Temp: 98.4 F (36.9 C)  SpO2: 98%  Weight: (!) 300 lb 9.6 oz (136.4 kg)  Height:  (1.753 m)    Physical Exam  Constitutional: She is oriented to person, place, and time. She appears well-developed and well-nourished.  HENT:  Head: Normocephalic and atraumatic.  Mouth/Throat: Oropharynx is clear and moist.  Eyes: EOM are normal. No scleral icterus.  Neck: Normal range of motion. Neck supple.  No  thyromegaly  Cardiovascular: Normal rate, regular rhythm and normal heart sounds.  Pulmonary/Chest: Effort normal and breath sounds normal. No stridor. No respiratory distress. She has no wheezes. She has no rales. She exhibits no tenderness.  Neurological: She is alert and oriented to person, place, and time.  Skin: Skin is warm. Capillary refill takes less than 2 seconds.    Assessment and Plan Karnisha was seen today for headache.  Diagnoses and all orders for this visit:  Prediabetes- advised pt to follow up in on months for weight check -     Hemoglobin A1c -     Ambulatory referral to diabetic education  Vitamin D insufficiency -     VITAMIN D 25 Hydroxy (Vit-D Deficiency, Fractures)  Morbid obesity (HCC)- follow up in one month for weight loss strategies     Kumari Sculley A Graciana Sessa

## 2018-03-08 ENCOUNTER — Ambulatory Visit: Payer: BLUE CROSS/BLUE SHIELD | Admitting: Physician Assistant

## 2018-03-08 LAB — HEMOGLOBIN A1C
ESTIMATED AVERAGE GLUCOSE: 105 mg/dL
HEMOGLOBIN A1C: 5.3 % (ref 4.8–5.6)

## 2018-03-08 LAB — VITAMIN D 25 HYDROXY (VIT D DEFICIENCY, FRACTURES): VIT D 25 HYDROXY: 19.7 ng/mL — AB (ref 30.0–100.0)

## 2018-03-10 ENCOUNTER — Encounter: Payer: Self-pay | Admitting: Radiology

## 2018-04-10 ENCOUNTER — Ambulatory Visit: Payer: BLUE CROSS/BLUE SHIELD | Admitting: Physician Assistant

## 2018-04-10 ENCOUNTER — Ambulatory Visit (INDEPENDENT_AMBULATORY_CARE_PROVIDER_SITE_OTHER): Payer: BLUE CROSS/BLUE SHIELD

## 2018-04-10 ENCOUNTER — Encounter: Payer: Self-pay | Admitting: Physician Assistant

## 2018-04-10 ENCOUNTER — Other Ambulatory Visit: Payer: Self-pay

## 2018-04-10 ENCOUNTER — Telehealth: Payer: Self-pay

## 2018-04-10 VITALS — BP 122/70 | HR 94 | Temp 98.3°F | Ht 68.0 in | Wt 293.8 lb

## 2018-04-10 DIAGNOSIS — M25561 Pain in right knee: Secondary | ICD-10-CM | POA: Diagnosis not present

## 2018-04-10 DIAGNOSIS — K219 Gastro-esophageal reflux disease without esophagitis: Secondary | ICD-10-CM

## 2018-04-10 DIAGNOSIS — G8929 Other chronic pain: Secondary | ICD-10-CM | POA: Diagnosis not present

## 2018-04-10 DIAGNOSIS — M25562 Pain in left knee: Secondary | ICD-10-CM | POA: Diagnosis not present

## 2018-04-10 DIAGNOSIS — R0789 Other chest pain: Secondary | ICD-10-CM | POA: Diagnosis not present

## 2018-04-10 LAB — POCT CBC
Granulocyte percent: 63 %G (ref 37–80)
HCT, POC: 37.7 % (ref 37.7–47.9)
Hemoglobin: 12.6 g/dL (ref 12.2–16.2)
LYMPH, POC: 1.7 (ref 0.6–3.4)
MCH, POC: 29 pg (ref 27–31.2)
MCHC: 33.4 g/dL (ref 31.8–35.4)
MCV: 86.7 fL (ref 80–97)
MID (CBC): 0.3 (ref 0–0.9)
MPV: 9.3 fL (ref 0–99.8)
POC Granulocyte: 3.4 (ref 2–6.9)
POC LYMPH PERCENT: 32.1 %L (ref 10–50)
POC MID %: 4.9 %M (ref 0–12)
Platelet Count, POC: 221 10*3/uL (ref 142–424)
RBC: 4.35 M/uL (ref 4.04–5.48)
RDW, POC: 13.4 %
WBC: 5.4 10*3/uL (ref 4.6–10.2)

## 2018-04-10 LAB — D-DIMER, QUANTITATIVE: D-DIMER: 0.39 mg/L FEU (ref 0.00–0.49)

## 2018-04-10 LAB — TROPONIN I

## 2018-04-10 MED ORDER — IBUPROFEN 600 MG PO TABS: 600.0000 mg | ORAL_TABLET | Freq: Three times a day (TID) | ORAL | 3 refills | Status: DC | PRN

## 2018-04-10 MED ORDER — GI COCKTAIL ~~LOC~~
30.0000 mL | Freq: Once | ORAL | Status: AC
  Administered 2018-04-10: 30 mL via ORAL

## 2018-04-10 MED ORDER — FAMOTIDINE 20 MG PO TABS: 20.0000 mg | ORAL_TABLET | Freq: Two times a day (BID) | ORAL | 1 refills | Status: DC

## 2018-04-10 NOTE — Progress Notes (Signed)
04/10/2018 9:57 AM   DOB: Jul 29, 1981 / MRN: [id number]  SUBJECTIVE:  Heather [name] is a 37 y.o. female presenting for 2 weeks of dull aching chest pain.  Patient associates both cough and GERD symptoms.  She tells me she cannot be pregnant [name] because she is not having sex.  She denies shortness of breath, nausea, diaphoresis, disproportionate DOE.  She has no history of diabetes but does have a history of prediabetes in remission.  No history of hypertension.  She is a never smoker.  She has no history of  DVT/PE and denies leg swelling posterior calf pain.  She does not take OCPs or estrogen products.  She complains of bilateral knee pain.  This is an old complaint.  She is been to physical therapy in the past for this.  Tends to get better with therapy prescription strength NSAIDs.     She has No Known Allergies.   She  has a past medical history of Anemia, Carpal tunnel syndrome, left (06/2012), Ganglion cyst (06/2012), Herpes genitalia, and Postpartum care following vaginal delivery (11/6) (09/15/2016).    She  reports that she has never smoked. She has never used smokeless tobacco. She reports that she does not drink alcohol or use drugs. She  reports that she currently engages in sexual activity. She reports using the following method of birth control/protection: None. The patient  has a past surgical history that includes Wisdom tooth extraction and Carpal tunnel release (06/28/2012).  Her family history includes Breast cancer in her maternal aunt, maternal aunt, and maternal uncle; Hyperlipidemia in her father; Hypertension in her father and mother.  Review of Systems  Constitutional: Negative for chills and fever.  Respiratory: Negative for cough, hemoptysis, sputum production and shortness of breath.   Cardiovascular: Positive for chest pain. Negative for palpitations, orthopnea, claudication, leg swelling and PND.  Neurological: Negative for dizziness.    The problem list and  medications were reviewed and updated by myself where necessary and exist elsewhere in the encounter.   OBJECTIVE:  BP 122/70 (BP Location: Left Arm, Patient Position: Sitting, Cuff Size: Large)   Pulse 94   Temp 98.3 F (36.8 C) (Oral)   Ht 5\' 8"  (1.727 m)   Wt 293 lb 12.8 oz (133.3 kg)   SpO2 97%   BMI 44.67 kg/m   Lab Results  Component Value Date   HGBA1C 5.3 03/06/2018     Physical Exam  Constitutional: She is oriented to person, place, and time. She appears well-nourished.  Non-toxic appearance. No distress.  HENT:  Right Ear: Hearing, tympanic membrane, external ear and ear canal normal.  Left Ear: Hearing, tympanic membrane, external ear and ear canal normal.  Nose: Nose normal. Right sinus exhibits no maxillary sinus tenderness and no frontal sinus tenderness. Left sinus exhibits no maxillary sinus tenderness and no frontal sinus tenderness.  Mouth/Throat: Uvula is midline, oropharynx is clear and moist and mucous membranes are normal. Mucous membranes are not dry. No oropharyngeal exudate, posterior oropharyngeal edema or tonsillar abscesses.  Eyes: Pupils are equal, round, and reactive to light. EOM are normal.  Cardiovascular: Normal rate, regular rhythm, S1 normal, S2 normal, normal heart sounds and intact distal pulses. Exam reveals no gallop, no friction rub and no decreased pulses.  No murmur heard. Pulmonary/Chest: Effort normal. No stridor. No respiratory distress. She has no wheezes. She has no rales.  Abdominal: She exhibits no distension.  Musculoskeletal: She exhibits no edema.  Lymphadenopathy:  Head (right side): No submandibular and no tonsillar adenopathy present.       Head (left side): No submandibular and no tonsillar adenopathy present.    She has no cervical adenopathy.  Neurological: She is alert and oriented to person, place, and time. She has normal strength and normal reflexes. She is not disoriented. She displays no atrophy. No cranial  nerve deficit or sensory deficit. She exhibits normal muscle tone. Coordination and gait normal.  Skin: Skin is warm and dry. She is not diaphoretic. No pallor.  Psychiatric: She has a normal mood and affect. Her behavior is normal.  Vitals reviewed.   No results found for this or any previous visit (from the past 72 hour(s)).  Dg Chest 2 View  Result Date: 04/10/2018 CLINICAL DATA:  Dull precordial chest pain. Normal vital signs. EXAM: CHEST - 2 VIEW COMPARISON:  None. FINDINGS: Cardiomediastinal silhouette is within normal limits in size and configuration. Lungs are clear. Lung volumes are normal. No evidence of pneumonia. No pleural effusion. No pneumothorax seen. Osseous and soft tissue structures about the chest are unremarkable. IMPRESSION: No active cardiopulmonary disease. Electronically Signed   By: Bary RichardStan  Maynard M.D.   On: 04/10/2018 09:47    ASSESSMENT AND PLAN:  Heather [name] was seen [name] for generalized body aches and chest pain.  Diagnoses and all orders for this visit:  Atypical chest pain -     EKG 12-Lead -     Troponin I -     D-dimer, quantitative (not at [facility]) -     Basic Metabolic Panel -     POCT CBC -     DG Chest 2 View; Future -     gi cocktail (Maalox,Lidocaine,Donnatal) -     Care order/instruction:  Chronic pain of both knees -     ibuprofen (ADVIL,MOTRIN) 600 MG tablet; Take 1 tablet (600 mg total) by mouth every 8 (eight) hours as needed.  Gastroesophageal reflux disease without esophagitis -     famotidine (PEPCID) 20 MG tablet; Take 1 tablet (20 mg total) by mouth 2 (two) times daily.    The patient is advised to call or return to clinic if she does not see an improvement in symptoms, or to seek the care of the closest emergency department if she worsens with the above plan.   Deliah BostonMichael Clark, MHS, PA-C Primary Care at [facility] 04/10/2018 9:57 AM

## 2018-04-10 NOTE — Telephone Encounter (Addendum)
I have called the patient and relayed to her that her stat testing with the D-dimer has come back negative per provider Deliah BostonMichael clark request.   Pt stated understanding  Clydene PughMei Hua  Chan Velasco, CMA

## 2018-04-10 NOTE — Patient Instructions (Addendum)
Chest pain appears to be noncardiac in nature thus far.  I am checking a d-dimer which evaluate for the possibility of a blood clot and I am checking a troponin which evaluates for the possibility of a myocardial infarction.  These test are negative and the chest pain is non-life-threatening and most likely musculoskeletal or GERD.  Please take the medications as I have prescribed them and please do not take any ibuprofen until hearing from us today with regard to your test results.    IF you received an x-ray today, you will receive an invoice from [facility]. Please contact [facility] at [phone] with questions or concerns regarding your invoice.   IF you received labwork today, you will receive an invoice from [facility]. Please contact [facility] at [phone] with questions or concerns regarding your invoice.   Our billing staff will not be able to assist you with questions regarding bills from these companies.  You will be contacted with the lab results as soon as they are available. The fastest way to get your results is to activate your My Chart account. Instructions are located on the last page of this paperwork. If you have not heard from us regarding the results in 2 weeks, please contact this office.

## 2018-04-11 LAB — BASIC METABOLIC PANEL
BUN/Creatinine Ratio: 17 (ref 9–23)
BUN: 14 mg/dL (ref 6–20)
CALCIUM: 9.7 mg/dL (ref 8.7–10.2)
CO2: 19 mmol/L — AB (ref 20–29)
CREATININE: 0.82 mg/dL (ref 0.57–1.00)
Chloride: 105 mmol/L (ref 96–106)
GFR calc Af Amer: 107 mL/min/{1.73_m2} (ref >59)
GFR calc non Af Amer: 93 mL/min/{1.73_m2} (ref >59)
Glucose: 87 mg/dL (ref 65–99)
Potassium: 4.4 mmol/L (ref 3.5–5.2)
Sodium: 138 mmol/L (ref 134–144)

## 2018-06-17 ENCOUNTER — Other Ambulatory Visit: Payer: Self-pay | Admitting: Sports Medicine

## 2018-06-17 DIAGNOSIS — M25561 Pain in right knee: Secondary | ICD-10-CM

## 2018-06-23 ENCOUNTER — Inpatient Hospital Stay
Admission: RE | Admit: 2018-06-23 | Discharge: 2018-06-23 | Disposition: A | Discharge status: Home / Self Care | Payer: BLUE CROSS/BLUE SHIELD | Source: Ambulatory Visit | Attending: Sports Medicine | Admitting: Sports Medicine

## 2018-06-27 ENCOUNTER — Ambulatory Visit
Admission: RE | Admit: 2018-06-27 | Discharge: 2018-06-27 | Disposition: A | Discharge status: Home / Self Care | Payer: BLUE CROSS/BLUE SHIELD | Source: Ambulatory Visit | Attending: Sports Medicine | Admitting: Sports Medicine

## 2018-06-27 DIAGNOSIS — M25561 Pain in right knee: Secondary | ICD-10-CM

## 2018-08-11 ENCOUNTER — Ambulatory Visit (HOSPITAL_COMMUNITY)
Admission: EM | Admit: 2018-08-11 | Discharge: 2018-08-11 | Disposition: A | Discharge status: Home / Self Care | Payer: BLUE CROSS/BLUE SHIELD | Attending: Family Medicine | Admitting: Family Medicine

## 2018-08-11 ENCOUNTER — Encounter (HOSPITAL_COMMUNITY): Payer: Self-pay | Admitting: Emergency Medicine

## 2018-08-11 ENCOUNTER — Other Ambulatory Visit: Payer: Self-pay

## 2018-08-11 DIAGNOSIS — M25521 Pain in right elbow: Secondary | ICD-10-CM

## 2018-08-11 DIAGNOSIS — M542 Cervicalgia: Secondary | ICD-10-CM | POA: Diagnosis not present

## 2018-08-11 MED ORDER — MELOXICAM 7.5 MG PO TABS: 7.5000 mg | ORAL_TABLET | Freq: Every day | ORAL | 0 refills | Status: DC

## 2018-08-11 NOTE — ED Provider Notes (Signed)
[facility]    CSN: [id number] Arrival date & time: 08/11/18  1839     History   Chief Complaint Chief Complaint  Patient presents with  . Neck Pain    HPI Heather [name] is a 37 y.o. female.   Heather [name] presents with complaints of right neck pain as well as right antecubital elbow pain which has been intermittent for the past week. Dull, irritating and sharp at times. 2/10 in severity when it comes. No known triggers or exacerbating factors. No new dizziness or headaches. No injury to head or neck, recent mvc or chiropractic manipulation. States her office chair is broken so feels her posture has been off. Hx of carpal tunnel. States does have some chronic pain at baseline but this is different. No URI symptoms.    ROS per HPI.      Past Medical History:  Diagnosis Date  . Anemia   . Carpal tunnel syndrome, left 06/2012  . Ganglion cyst 06/2012   left volar  . Herpes genitalia   . Postpartum care following vaginal delivery (11/6) 09/15/2016    Patient Active Problem List   Diagnosis Date Noted  . Hypovitaminosis D 08/11/2017  . Right carpal tunnel syndrome 03/06/2017  . Left carpal tunnel syndrome 03/06/2017  . Postpartum care following vaginal delivery (11/6) 09/15/2016    Past Surgical History:  Procedure Laterality Date  . CARPAL TUNNEL RELEASE  06/28/2012   Procedure: CARPAL TUNNEL RELEASE;  Surgeon: Tami Ribas, MD;  Location: [facility];  Service: Orthopedics;  Laterality: Left;  EXCISION VOLAR GANGLION CYST and carpal tunnel release left timeouts for ganglion cyst done at same time as timeouts for carpal tunnel with same staffs  . WISDOM TOOTH EXTRACTION      OB History    Gravida  4   Para  2   Term  2   Preterm      AB  2   Living  2     SAB  1   TAB  1   Ectopic      Multiple  0   Live Births  1            Home Medications    Prior to Admission medications   Medication Sig Start Date End Date  Taking? Authorizing Provider  aspirin 325 MG tablet Take 325 mg by mouth daily.   Yes [provider]  b complex vitamins tablet Take 1 tablet by mouth daily.   Yes [provider]  Multiple Vitamin (MULTIVITAMIN) tablet Take 1 tablet by mouth daily.   Yes [provider]  famotidine (PEPCID) 20 MG tablet Take 1 tablet (20 mg total) by mouth 2 (two) times daily. 04/10/18   Ofilia Neas, PA-C  ibuprofen (ADVIL,MOTRIN) 600 MG tablet Take 1 tablet (600 mg total) by mouth every 8 (eight) hours as needed. 04/10/18   Ofilia Neas, PA-C  meloxicam (MOBIC) 7.5 MG tablet Take 1 tablet (7.5 mg total) by mouth daily. 08/11/18   Georgetta Haber, NP  Vitamin D, Ergocalciferol, (DRISDOL) 50000 units CAPS capsule Take 1 capsule (50,000 Units total) by mouth every 7 (seven) days. 08/11/17   Ofilia Neas, PA-C    Family History Family History  Problem Relation Age of Onset  . Hypertension Mother   . Hypertension Father   . Hyperlipidemia Father   . Breast cancer Maternal Aunt        pt unsure  . Breast cancer Maternal  Uncle   . Breast cancer Maternal Aunt        pt unsure    Social History Social History   Tobacco Use  . Smoking status: Never Smoker  . Smokeless tobacco: Never Used  Substance Use Topics  . Alcohol use: No  . Drug use: No     Allergies   Patient has no known allergies.   Review of Systems Review of Systems   Physical Exam Triage Vital Signs ED Triage Vitals  Enc Vitals Group     BP 08/11/18 1909 137/76     Pulse Rate 08/11/18 1909 72     Resp 08/11/18 1909 18     Temp 08/11/18 1909 98.8 F (37.1 C)     Temp Source 08/11/18 1909 Oral     SpO2 08/11/18 1909 100 %     Weight --      Height --      Head Circumference --      Peak Flow --      Pain Score 08/11/18 1907 2     Pain Loc --      Pain Edu? --      Excl. in GC? --    No data found.  Updated Vital Signs BP 137/76 (BP Location: Left Arm)   Pulse 72   Temp 98.8 F  (37.1 C) (Oral)   Resp 18   LMP 07/22/2018   SpO2 100%   Visual Acuity Right Eye Distance:   Left Eye Distance:   Bilateral Distance:    Right Eye Near:   Left Eye Near:    Bilateral Near:     Physical Exam  Constitutional: She is oriented to person, place, and time. She appears well-developed and well-nourished. No distress.  Neck: Trachea normal, normal range of motion and full passive range of motion without pain. Normal carotid pulses and no JVD present. No spinous process tenderness and no muscular tenderness present. Carotid bruit is not present. No neck rigidity. No edema and normal range of motion present.  Cardiovascular: Normal rate, regular rhythm and normal heart sounds.  Pulmonary/Chest: Effort normal and breath sounds normal.  Musculoskeletal:       Right elbow: Normal.She exhibits normal range of motion, no swelling, no effusion, no deformity and no laceration. No tenderness found.  Neurological: She is alert and oriented to person, place, and time.  Skin: Skin is warm and dry.     UC Treatments / Results  Labs (all labs ordered are listed, but only abnormal results are displayed) Labs Reviewed - No data to display  EKG None  Radiology No results found.  Procedures Procedures (including critical care time)  Medications Ordered in UC Medications - No data to display  Initial Impression / Assessment and Plan / UC Course  I have reviewed the triage vital signs and the nursing notes.  Pertinent labs & imaging results that were available during my care of the patient were reviewed by me and considered in my medical decision making (see chart for details).     Unable to reproduce pain or symptoms in clinic today. No acute findings on exam. Non toxic in appearance. No red flag findings in clinic today. Continue with supportive cares. meloxicam provided. Return precautions provided. If symptoms worsen or do not improve in the next week to return to be seen or  to follow up with PCP.  Patient verbalized understanding and agreeable to plan.   Final Clinical Impressions(s) / UC Diagnoses   Final  diagnoses:  Neck pain  Elbow pain, right     Discharge Instructions     Exam is reassuring today, no red flag findings.  We will try an antiinflammatory which you take daily, to see if this is helpful with pain.  Take with food.  Light and regular activity as tolerated.  Heat and/or ice application as needed to help with pain.  If persistent please follow up with your primary care provider.  If develop worsening of symptoms, increased pain, headache, vision changes, weakness, numbness/tingling or otherwise worsening please return or go to the Er.    ED Prescriptions    Medication Sig Dispense Auth. Provider   meloxicam (MOBIC) 7.5 MG tablet Take 1 tablet (7.5 mg total) by mouth daily. 20 tablet Georgetta Haber, NP     Controlled Substance Prescriptions [place] Controlled Substance Registry consulted? Not Applicable   Georgetta Haber, NP 08/11/18 1930

## 2018-08-11 NOTE — ED Triage Notes (Signed)
Right neck and right antecubital pain for 3 days.  Pain is sharp, throbbing pain today.  Denies any injury

## 2018-08-11 NOTE — Discharge Instructions (Signed)
Exam is reassuring today, no red flag findings.  We will try an antiinflammatory which you take daily, to see if this is helpful with pain.  Take with food.  Light and regular activity as tolerated.  Heat and/or ice application as needed to help with pain.  If persistent please follow up with your primary care provider.  If develop worsening of symptoms, increased pain, headache, vision changes, weakness, numbness/tingling or otherwise worsening please return or go to the Er.

## 2018-10-04 ENCOUNTER — Encounter: Payer: Self-pay | Admitting: Family Medicine

## 2018-10-04 ENCOUNTER — Ambulatory Visit: Payer: BLUE CROSS/BLUE SHIELD | Admitting: Family Medicine

## 2018-10-04 ENCOUNTER — Other Ambulatory Visit: Payer: Self-pay

## 2018-10-04 VITALS — BP 129/70 | HR 74 | Temp 98.0°F | Resp 16 | Ht 68.0 in | Wt 286.0 lb

## 2018-10-04 DIAGNOSIS — R42 Dizziness and giddiness: Secondary | ICD-10-CM | POA: Diagnosis not present

## 2018-10-04 DIAGNOSIS — M791 Myalgia, unspecified site: Secondary | ICD-10-CM

## 2018-10-04 DIAGNOSIS — M898X1 Other specified disorders of bone, shoulder: Secondary | ICD-10-CM

## 2018-10-04 DIAGNOSIS — M25512 Pain in left shoulder: Secondary | ICD-10-CM

## 2018-10-04 DIAGNOSIS — H6982 Other specified disorders of Eustachian tube, left ear: Secondary | ICD-10-CM

## 2018-10-04 DIAGNOSIS — E559 Vitamin D deficiency, unspecified: Secondary | ICD-10-CM | POA: Diagnosis not present

## 2018-10-04 DIAGNOSIS — R7303 Prediabetes: Secondary | ICD-10-CM

## 2018-10-04 DIAGNOSIS — M25561 Pain in right knee: Secondary | ICD-10-CM

## 2018-10-04 DIAGNOSIS — M7918 Myalgia, other site: Secondary | ICD-10-CM

## 2018-10-04 DIAGNOSIS — M25562 Pain in left knee: Secondary | ICD-10-CM

## 2018-10-04 DIAGNOSIS — Z6841 Body Mass Index (BMI) 40.0 and over, adult: Secondary | ICD-10-CM

## 2018-10-04 DIAGNOSIS — R768 Other specified abnormal immunological findings in serum: Secondary | ICD-10-CM

## 2018-10-04 DIAGNOSIS — M2550 Pain in unspecified joint: Secondary | ICD-10-CM

## 2018-10-04 DIAGNOSIS — K219 Gastro-esophageal reflux disease without esophagitis: Secondary | ICD-10-CM

## 2018-10-04 DIAGNOSIS — M546 Pain in thoracic spine: Secondary | ICD-10-CM

## 2018-10-04 DIAGNOSIS — G8929 Other chronic pain: Secondary | ICD-10-CM

## 2018-10-04 LAB — POCT CBC
Granulocyte percent: 53.7 %G (ref 37–80)
HCT, POC: 36.2 % (ref 29–41)
Hemoglobin: 12.1 g/dL (ref 9.5–13.5)
LYMPH, POC: 2.5 (ref 0.6–3.4)
MCH, POC: 29.1 pg (ref 27–31.2)
MCHC: 33.5 g/dL (ref 31.8–35.4)
MCV: 87 fL (ref 76–111)
MID (CBC): 0.2 (ref 0–0.9)
MPV: 8.8 fL (ref 0–99.8)
PLATELET COUNT, POC: 246 10*3/uL (ref 142–424)
POC Granulocyte: 3.1 (ref 2–6.9)
POC LYMPH PERCENT: 42.4 %L (ref 10–50)
POC MID %: 3.9 % (ref 0–12)
RBC: 4.17 M/uL (ref 4.04–5.48)
RDW, POC: 13.1 %
WBC: 5.8 10*3/uL (ref 4.6–10.2)

## 2018-10-04 LAB — POCT GLYCOSYLATED HEMOGLOBIN (HGB A1C): HEMOGLOBIN A1C: 5.6 % (ref 4.0–5.6)

## 2018-10-04 LAB — POCT SEDIMENTATION RATE: POCT SED RATE: 50 mm/hr — AB (ref 0–22)

## 2018-10-04 LAB — POCT URINE PREGNANCY: Preg Test, Ur: NEGATIVE

## 2018-10-04 MED ORDER — TRIAMCINOLONE ACETONIDE 55 MCG/ACT NA AERO: 2.0000 | INHALATION_SPRAY | Freq: Every day | NASAL | 12 refills | Status: DC

## 2018-10-04 MED ORDER — CETIRIZINE HCL 10 MG PO TABS: 10.0000 mg | ORAL_TABLET | Freq: Every day | ORAL | 11 refills | Status: DC

## 2018-10-04 NOTE — Progress Notes (Signed)
Subjective:    Patient: Heather [name]  DOB: 01/18/1981; 37 y.o.   MRN: [id number]  Chief Complaint  Patient presents with  . Body Pain    pt states for 1 year she has been having pain all over   . Dizziness    HPI Dizziness just started within the last 3 weeks with little spells. Not room spinning or loose balance but does notice it, even when her eyes are closed.  She had similar sxs when pregnant. Just lasts few a minutes and does tend to happen more with position/head change.  NO URI sxs/seasonal allergies. Tol po well and keeping very well hydrated.  Has been trying to cut out salt and butter from her diet and drinking more water, cutting out fried foods and meat - each week she will try to cut out something else from her diet which is a sig change from eating everything.  Has been loosing a little weight with this since she started her  Going to PT 2x/wk for her knees but has LESS dizziness w/ this.  Has been having more pressure/pain in Left ear - feels llike it is filled with water. Told orthodontist who told her it may be due to adjustments of bite.  Did get new glasses - sees floaters which her eye doctor is aware of. Got them 1-2 weeks before dizziness started.  Has chronic HAs that go away when she sleep.  Youngest girl just turned 2 sev wks ago. Also has 58 yo son. Not migraines. Has always had but more when is not rested.  Treated with generic excedrin  Will feel sharp pains by collar bone, shoulder blades, dull chest pain on each side. Should pain on left side radiating down back of left arm occurs most. Is intermittent. Had such severe episode in Oct w/ no known onset that she went to [facility]. Sometimes goes up to neck/jaw.  Works on [employer] most of days [employer] - 8 hrs/d so pretty stessfull - has been there since 07/2017 and pain started shortly after that.  No ergonomic work station. Has tried ibuprofen which helps temporarily. Started TENS machine last week which does help  back/shoulder, ice, heating pad, stretching.   Was rx'd meloxicam at [facility] for inflammation but didn't want to take since breast feeding so using asa instead  Was seen here 04/10/18 by Chestine Spore for CP and sxs resolve with GI cocktail - cut out foods that were causing.  Has been having some orthostatic dizziness  Occ palp but unconnected to dizziness episodes and were also occurring in June when she had a EKG here.  No UTI sxs Periods normal.    Medical History Past Medical History:  Diagnosis Date  . Anemia   . Carpal tunnel syndrome, left 06/2012  . Ganglion cyst 06/2012   left volar  . Herpes genitalia   . Postpartum care following vaginal delivery (11/6) 09/15/2016   Past Surgical History:  Procedure Laterality Date  . CARPAL TUNNEL RELEASE  06/28/2012   Procedure: CARPAL TUNNEL RELEASE;  Surgeon: Tami Ribas, MD;  Location: [facility];  Service: Orthopedics;  Laterality: Left;  EXCISION VOLAR GANGLION CYST and carpal tunnel release left timeouts for ganglion cyst done at same time as timeouts for carpal tunnel with same staffs  . WISDOM TOOTH EXTRACTION     Current Outpatient Medications on File Prior to Visit  Medication Sig Dispense Refill  . aspirin 325 MG tablet Take 325 mg by mouth daily.    Marland Kitchen  b complex vitamins tablet Take 1 tablet by mouth daily.    Marland Kitchen ibuprofen (ADVIL,MOTRIN) 600 MG tablet Take 1 tablet (600 mg total) by mouth every 8 (eight) hours as needed. 30 tablet 3  . Multiple Vitamin (MULTIVITAMIN) tablet Take 1 tablet by mouth daily.    . Vitamin D, Ergocalciferol, (DRISDOL) 50000 units CAPS capsule Take 1 capsule (50,000 Units total) by mouth every 7 (seven) days. 14 capsule 0   No current facility-administered medications on file prior to visit.    No Known Allergies Family History  Problem Relation Age of Onset  . Hypertension Mother   . Hypertension Father   . Hyperlipidemia Father   . Breast cancer Maternal Aunt        pt unsure  . Breast  cancer Maternal Uncle   . Breast cancer Maternal Aunt        pt unsure   Social History   Socioeconomic History  . Marital status: Married    Spouse name: Not on file  . Number of children: Not on file  . Years of education: Not on file  . Highest education level: Not on file  Occupational History  . Not on file  Social Needs  . Financial resource strain: Not on file  . Food insecurity:    Worry: Not on file    Inability: Not on file  . Transportation needs:    Medical: Not on file    Non-medical: Not on file  Tobacco Use  . Smoking status: Never Smoker  . Smokeless tobacco: Never Used  Substance and Sexual Activity  . Alcohol use: No  . Drug use: No  . Sexual activity: Yes    Birth control/protection: None  Lifestyle  . Physical activity:    Days per week: Not on file    Minutes per session: Not on file  . Stress: Not on file  Relationships  . Social connections:    Talks on phone: Not on file    Gets together: Not on file    Attends religious service: Not on file    Active member of club or organization: Not on file    Attends meetings of clubs or organizations: Not on file    Relationship status: Not on file  Other Topics Concern  . Not on file  Social History Narrative  . Not on file   Depression screen [facility] 2/9 10/04/2018 04/10/2018 03/06/2018 08/10/2017 06/05/2017  Decreased Interest 0 0 0 0 0  Down, Depressed, Hopeless 0 0 0 0 0  PHQ - 2 Score 0 0 0 0 0    ROS As noted in HPI  Objective:  BP 129/70   Pulse 74   Temp 98 F (36.7 C) (Oral)   Resp 16   Ht 5\' 8"  (1.727 m)   Wt 286 lb (129.7 kg)   SpO2 95%   BMI 43.49 kg/m   Orthostatic Vitals Lying 117/74 HR 61 Sitting 126/79 HR 71 Standing at 0 min 112/77 HR 83 Physical Exam  Constitutional: She is oriented to person, place, and time. She appears well-developed and well-nourished. No distress.  HENT:  Head: Normocephalic and atraumatic.  Right Ear: External ear and ear canal normal. A middle  ear effusion (moderate) is present.  Left Ear: External ear and ear canal normal. A middle ear effusion (very small) is present.  Nose: Mucosal edema present. No rhinorrhea.  Mouth/Throat: Uvula is midline, oropharynx is clear and moist and mucous membranes are normal. No oropharyngeal exudate.  Eyes: Conjunctivae are normal. Right eye exhibits no discharge. Left eye exhibits no discharge. No scleral icterus.  Neck: Normal range of motion. Neck supple.  Cardiovascular: Normal rate, regular rhythm, normal heart sounds and intact distal pulses.  Pulmonary/Chest: Effort normal and breath sounds normal.  Musculoskeletal:       Left shoulder: She exhibits pain. She exhibits normal range of motion, no tenderness, no bony tenderness, no swelling, no effusion, no crepitus, no spasm and normal strength.       Thoracic back: She exhibits normal range of motion, no tenderness, no bony tenderness, no swelling, no deformity, no pain and no spasm.  Lymphadenopathy:    She has no cervical adenopathy.  Neurological: She is alert and oriented to person, place, and time.  Skin: Skin is warm and dry. She is not diaphoretic. No erythema.  Psychiatric: She has a normal mood and affect. Her behavior is normal.    POC TESTING Office Visit on 10/04/2018  Component Date Value Ref Range Status  . WBC 10/04/2018 5.8  4.6 - 10.2 K/uL Final  . Lymph, poc 10/04/2018 2.5  0.6 - 3.4 Final  . POC LYMPH PERCENT 10/04/2018 42.4  10 - 50 %L Final  . MID (cbc) 10/04/2018 0.2  0 - 0.9 Final  . POC MID % 10/04/2018 3.9  0 - 12 %M Final  . POC Granulocyte 10/04/2018 3.1  2 - 6.9 Final  . Granulocyte percent 10/04/2018 53.7  37 - 80 %G Final  . RBC 10/04/2018 4.17  4.04 - 5.48 M/uL Final  . Hemoglobin 10/04/2018 12.1  9.5 - 13.5 g/dL Final  . HCT, POC 16/08/9603 36.2  29 - 41 % Final  . MCV 10/04/2018 87.0  76 - 111 fL Final  . MCH, POC 10/04/2018 29.1  27 - 31.2 pg Final  . MCHC 10/04/2018 33.5  31.8 - 35.4 g/dL Final  .  RDW, POC 54/07/8118 13.1  % Final  . Platelet Count, POC 10/04/2018 246  142 - 424 K/uL Final  . MPV 10/04/2018 8.8  0 - 99.8 fL Final  . POCT SED RATE 10/04/2018 50* 0 - 22 mm/hr Final  . Uric Acid 10/04/2018 4.4  2.5 - 7.1 mg/dL Final              Therapeutic target for gout patients: <6.0  . CRP 10/04/2018 6  0 - 10 mg/L Final  . Anti Nuclear Antibody(ANA) 10/04/2018 Positive* Negative Final  . Rhuematoid fact SerPl-aCnc 10/04/2018 24.9* 0.0 - 13.9 IU/mL Final  . Glucose 10/04/2018 81  65 - 99 mg/dL Final  . BUN 14/78/2956 12  6 - 20 mg/dL Final  . Creatinine, Ser 10/04/2018 0.84  0.57 - 1.00 mg/dL Final  . GFR calc non Af Amer 10/04/2018 90  >59 mL/min/1.73 Final  . GFR calc Af Amer 10/04/2018 103  >59 mL/min/1.73 Final  . BUN/Creatinine Ratio 10/04/2018 14  9 - 23 Final  . Sodium 10/04/2018 139  134 - 144 mmol/L Final  . Potassium 10/04/2018 4.4  3.5 - 5.2 mmol/L Final  . Chloride 10/04/2018 104  96 - 106 mmol/L Final  . CO2 10/04/2018 20  20 - 29 mmol/L Final  . Calcium 10/04/2018 9.9  8.7 - 10.2 mg/dL Final  . Total Protein 10/04/2018 7.2  6.0 - 8.5 g/dL Final  . Albumin 21/30/8657 4.2  3.5 - 5.5 g/dL Final  . Globulin, Total 10/04/2018 3.0  1.5 - 4.5 g/dL Final  . Albumin/Globulin Ratio 10/04/2018 1.4  1.2 -  2.2 Final  . Bilirubin Total 10/04/2018 0.3  0.0 - 1.2 mg/dL Final  . Alkaline Phosphatase 10/04/2018 80  39 - 117 IU/L Final  . AST 10/04/2018 15  0 - 40 IU/L Final  . ALT 10/04/2018 14  0 - 32 IU/L Final  . TSH 10/04/2018 1.160  0.450 - 4.500 uIU/mL Final  . Hemoglobin A1C 10/04/2018 5.6  4.0 - 5.6 % Final  . Vit D, 25-Hydroxy 10/04/2018 14.5* 30.0 - 100.0 ng/mL Final   Comment: Vitamin D deficiency has been defined by the Institute of Medicine and an Endocrine Society practice guideline as a level of serum 25-OH vitamin D less than 20 ng/mL (1,2). The Endocrine Society went on to further define vitamin D insufficiency as a level between 21 and 29 ng/mL (2). 1. IOM  (Institute of Medicine). 2010. Dietary reference    intakes for calcium and D. [place]: The    [employer]. 2. Holick MF, Binkley [place], Bischoff-Ferrari HA, et al.    Evaluation, treatment, and prevention of vitamin D    deficiency: an Endocrine Society clinical practice    guideline. JCEM. 2011 Jul; 96(7):1911-30.   Marland Kitchen Preg Test, Ur 10/04/2018 Negative  Negative Final  . Total CK 10/04/2018 96  24 - 173 U/L Final     Assessment & Plan:   1. Myalgia   2. Arthralgia of multiple joints - was rx'd meloxicam 7.5 for her knees but never took it due to her concerns about transmission during ongoing breastfeeding (daughter 68 yo) but she is taking periodic prn asa.  3. Prediabetes - reasurred - resolved Lab Results  Component Value Date   HGBA1C 5.6 10/04/2018   HGBA1C 5.3 03/06/2018   HGBA1C 5.7 (H) 08/10/2017     4. Vitamin D deficiency - never took the 50K 1x/wk dose rx'd last yr - advised to try it x 6 mos - rx sent to pharm, then start otc 2000iu/d when complete and have level rechecked here or at rheum at 6 mo mark or after to ensure adequately replaced Lab Results  Component Value Date   VD25OH 14.5 (L) 10/04/2018   VD25OH 19.7 (L) 03/06/2018   VD25OH 17.0 (L) 08/10/2017     5. Class 3 severe obesity due to excess calories with serious comorbidity and body mass index (BMI) of 40.0 to 44.9 in adult [facility]) - reviewed need for diet and exercise  6. Dizziness and giddiness - suspect due to below, RTC for further eval if worsening. Pt noted she did have similar sxs while pregnant but luckily UPT neg today. 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 Strongly encouraged pt to RTC here or to her gyn Dr. Ernestina Penna to discuss contraceptive options until she is ready to grow her family - reminded pt that regular menses does not automatically exclude the poss of pregnancy  7.  Eustachian tube dysfunction, left - suspect this is cause of dizziness - start qhs nasal steroid and 24hr non-drowsy antihistamine  X 4 wks,  8. Chronic left-sided thoracic back pain   9. Chronic myofascial pain - Needs desk chair with good lumbar support and a standing desk to vary position - highly suspect her chronic left-sided back and shoulder pain being caused by lack of ergonomic work environment requiring her to spend 8+hrs/d at computer - will write letter to her employer/HR for egonamic desk chair w/ back  support and sitting to standing desk  10. Chronic left shoulder pain - had CXR 6 mos ago which showed decent visualiation of left shoulder/AC joint, left scapula w/ no gross abnml noted  11. Clavicle pain - CXR 6 mos ago show clavicles nml - hurting at ~1/3 distance from midline - similar to original fibro points but pt c/o more JOINT pain rather than MSK and not near as much in Right upper side/ext so doubt fibro - could be from neck muscles tugging/tightness? Or from static neck position looking at computer all day? Refer to rheum for further eval.  12.    Gastroesophageal reflux disease without esophagitis - CP prior was due to GERD - never needed med as sxs resolved when she eliminated trigger foods.  13.    Chronic pain of both knees - going to PT twice weekly, helping some  ADDENDUM: + RF WITH +ANA AND ELEV SED RATE - could have rheumatoid arthritis - will try to add on anti-ccp and refer to rheum for further eval.  Patient will continue on current chronic medications other than changes noted above, so ok to refill when needed.   See after visit summary for patient specific instructions.  Orders Placed This Encounter  Procedures  . Uric A+ANA+CRP+RF Qn  . Comprehensive metabolic panel  . TSH  . VITAMIN D 25 Hydroxy (Vit-D Deficiency, Fractures)  . CK  . Ambulatory referral to Physical Therapy    Referral Priority:   Routine    Referral Type:   Physical Medicine    Referral  Reason:   Specialty Services Required    Requested Specialty:   Physical Therapy    Number of Visits Requested:   1  . Ambulatory referral to Rheumatology    Referral Priority:   Routine    Referral Type:   Consultation    Referral Reason:   Specialty Services Required    Requested Specialty:   Rheumatology    Number of Visits Requested:   1  . Orthostatic vital signs  . POCT CBC  . POCT SEDIMENTATION RATE  . POCT glycosylated hemoglobin (Hb A1C)  . POCT urine pregnancy    Meds ordered this encounter  Medications  . cetirizine (ZYRTEC) 10 MG tablet    Sig: Take 1 tablet (10 mg total) by mouth at bedtime.    Dispense:  30 tablet    Refill:  11  . triamcinolone (NASACORT) 55 MCG/ACT AERO nasal inhaler    Sig: Place 2 sprays into the nose daily.    Dispense:  1 Inhaler    Refill:  12    Patient verbalized to me that they understand the following: diagnosis, what is being done for them, what to expect and what should be done at home.  Their questions have been answered. They understand that I am unable to predict every possible medication interaction or adverse outcome and that if any unexpected symptoms arise, they should contact us and their pharmacist, as well as never hesitate to seek urgent/emergent care at [facility] or ER if they think it might be warranted.    Over 40 min spent in face-to-face evaluation of and consultation with patient and coordination of care.  Over 50% of this time was spent counseling this patient regarding numerous above concerns, poss etiologies of joint pain, importance of ergonomic work-space, w/o of mult joint complaints, etiology of poss dizziness and trx, family planning.  Norberto Sorenson, MD, MPH [facility] at [facility]  [facility] [address] [place], [place]  [id number] [phone] Office phone  [phone] Office fax  10/04/18 3:54 PM

## 2018-10-04 NOTE — Patient Instructions (Addendum)
I have referred you to [facility] MANUAL THERAP[facility], MASSAGE, AND PT [email or web address]  You need a standing desk and a good desk chair with lumbar support. Your computer needs to be positioned in direct eye line so you are not looking up or down.  Use the cross-hand technique while you "look at your toes, and point up your nose" to inhaler 2 sprays of nasacort into each nare every night before bed x 1 month along with the cetrizine (Zyrtec) every night x 1 mos.  15 minutes of heat (try dry uncooked rice or beans in a pillow case or sock in the microwave x 2 minutes) to your shoulder, then lay on a tennis ball or firm plastic ball (can find with the yoga stuff in [employer]) and gently roll around - every night x 1 mo.  If you have lab work done today you will be contacted with your lab results within the next 2 weeks.  If you have not heard from [place] then please contact us. The fastest way to get your results is to register for My Chart.   IF you received an x-ray today, you will receive an invoice from [facility]. Please contact [facility] at [phone] with questions or concerns regarding your invoice.   IF you received labwork today, you will receive an invoice from [place]. Please contact [facility] at [phone] with questions or concerns regarding your invoice.   Our billing staff will not be able to assist you with questions regarding bills from these companies.  You will be contacted with the lab results as soon as they are available. The fastest way to get your results is to activate your My Chart account. Instructions are located on the last page of this paperwork. If you have not heard from [place] regarding the results in 2 weeks, please contact this office.    Back/Neck: How to set up your workstation   Key Points:  Chair:  An adjustable chair to fit your height with a slight downward tilt of chair seat is helpful.  Should have  good high-back support that assists with keeping the natural curves of your spine.  A "lumbar roll" or pillow at your low back can help you keep good posture.  Adjustable arm rests may decrease strain in upper body.   Keyboard:  Should be close and at elbow or just below elbow height.  Split keyboards can help decrease strain on wrist.  Wrist supports can provide mini-breaks to your wrists throughout the day.  Monitor/Screen:  Top of screen should be at eye level or slightly lower.  Good lighting is important to prevent eye strain, or headaches from glare.  Head Posture: A copy holder on either side of the monitor will help limit neck strain.  Head should not be forward.  Ears should line up with you shoulders.  Leg Posture:  Knee and hips should be bent half-way (about a 90 degree angle).  Shorter people may need something to prop their feet on, with knees bent, like a phone book.  Other Stuff: Keep the stuff (phones, pens, phonebooks, etc) that you use frequently close to you, so you're not straining to reach them.   Eustachian Tube Dysfunction The eustachian tube connects the middle ear to the back of the nose. It regulates air pressure in the middle ear by allowing air to move between the ear and nose. It also helps to drain fluid from the middle ear space. When the eustachian tube does not  function properly, air pressure, fluid, or both can build up in the middle ear. Eustachian tube dysfunction can affect one or both ears. What are the causes? This condition happens when the eustachian tube becomes blocked or cannot open normally. This may result from:  Ear infections.  Colds and other upper respiratory infections.  Allergies.  Irritation, such as from cigarette smoke or acid from the stomach coming up into the esophagus (gastroesophageal reflux).  Sudden changes in air pressure, such as from descending in an airplane.  Abnormal growths in the nose or throat, such as nasal  polyps, tumors, or enlarged tissue at the back of the throat (adenoids).  What increases the risk? This condition may be more likely to develop in people who smoke and people who are overweight. Eustachian tube dysfunction may also be more likely to develop in children, especially children who have:  Certain birth defects of the mouth, such as cleft palate.  Large tonsils and adenoids.  What are the signs or symptoms? Symptoms of this condition may include:  A feeling of fullness in the ear.  Ear pain.  Clicking or popping noises in the ear.  Ringing in the ear.  Hearing loss.  Loss of balance.  Symptoms may get worse when the air pressure around you changes, such as when you travel to an area of high elevation or fly on an airplane. How is this diagnosed? This condition may be diagnosed based on:  Your symptoms.  A physical exam of your ear, nose, and throat.  Tests, such as those that measure: ? The movement of your eardrum (tympanogram). ? Your hearing (audiometry).  How is this treated? Treatment depends on the cause and severity of your condition. If your symptoms are mild, you may be able to relieve your symptoms by moving air into ("popping") your ears. If you have symptoms of fluid in your ears, treatment may include:  Decongestants.  Antihistamines.  Nasal sprays or ear drops that contain medicines that reduce swelling (steroids).  In some cases, you may need to have a procedure to drain the fluid in your eardrum (myringotomy). In this procedure, a small tube is placed in the eardrum to:  Drain the fluid.  Restore the air in the middle ear space.  Follow these instructions at home:  Take over-the-counter and prescription medicines only as told by your health care provider.  Use techniques to help pop your ears as recommended by your health care provider. These may include: ? Chewing gum. ? Yawning. ? Frequent, forceful swallowing. ? Closing your  mouth, holding your nose closed, and gently blowing as if you are trying to blow air out of your nose.  Do not do any of the following until your health care provider approves: ? Travel to high altitudes. ? Fly in airplanes. ? Work in a Estate agentpressurized cabin or room. ? Scuba dive.  Keep your ears dry. Dry your ears completely after showering or bathing.  Do not smoke.  Keep all follow-up visits as told by your health care provider. This is important. Contact a health care provider if:  Your symptoms do not go away after treatment.  Your symptoms come back after treatment.  You are unable to pop your ears.  You have: ? A fever. ? Pain in your ear. ? Pain in your head or neck. ? Fluid draining from your ear.  Your hearing suddenly changes.  You become very dizzy.  You lose your balance. This information is not intended to  replace advice given to you by your health care provider. Make sure you discuss any questions you have with your health care provider. Document Released: 11/23/2015 Document Revised: 04/03/2016 Document Reviewed: 11/15/2014 Elsevier Interactive Patient Education  [employer].

## 2018-10-05 LAB — COMPREHENSIVE METABOLIC PANEL
A/G RATIO: 1.4 (ref 1.2–2.2)
ALT: 14 IU/L (ref 0–32)
AST: 15 IU/L (ref 0–40)
Albumin: 4.2 g/dL (ref 3.5–5.5)
Alkaline Phosphatase: 80 IU/L (ref 39–117)
BUN/Creatinine Ratio: 14 (ref 9–23)
BUN: 12 mg/dL (ref 6–20)
Bilirubin Total: 0.3 mg/dL (ref 0.0–1.2)
CALCIUM: 9.9 mg/dL (ref 8.7–10.2)
CO2: 20 mmol/L (ref 20–29)
Chloride: 104 mmol/L (ref 96–106)
Creatinine, Ser: 0.84 mg/dL (ref 0.57–1.00)
GFR, EST AFRICAN AMERICAN: 103 mL/min/{1.73_m2} (ref >59)
GFR, EST NON AFRICAN AMERICAN: 90 mL/min/{1.73_m2} (ref >59)
Globulin, Total: 3 g/dL (ref 1.5–4.5)
Glucose: 81 mg/dL (ref 65–99)
POTASSIUM: 4.4 mmol/L (ref 3.5–5.2)
Sodium: 139 mmol/L (ref 134–144)
TOTAL PROTEIN: 7.2 g/dL (ref 6.0–8.5)

## 2018-10-05 LAB — URIC A+ANA+CRP+RF QN
Anti Nuclear Antibody(ANA): POSITIVE — AB
CRP: 6 mg/L (ref 0–10)
RHEUMATOID FACTOR: 24.9 [IU]/mL — AB (ref 0.0–13.9)
Uric Acid: 4.4 mg/dL (ref 2.5–7.1)

## 2018-10-05 LAB — CK: CK TOTAL: 96 U/L (ref 24–173)

## 2018-10-05 LAB — TSH: TSH: 1.16 u[IU]/mL (ref 0.450–4.500)

## 2018-10-05 LAB — VITAMIN D 25 HYDROXY (VIT D DEFICIENCY, FRACTURES): Vit D, 25-Hydroxy: 14.5 ng/mL — ABNORMAL LOW (ref 30.0–100.0)

## 2018-10-08 ENCOUNTER — Encounter: Payer: Self-pay | Admitting: Family Medicine

## 2018-10-09 DIAGNOSIS — Z6841 Body Mass Index (BMI) 40.0 and over, adult: Secondary | ICD-10-CM

## 2018-10-09 DIAGNOSIS — M2550 Pain in unspecified joint: Secondary | ICD-10-CM | POA: Insufficient documentation

## 2018-10-09 DIAGNOSIS — M546 Pain in thoracic spine: Secondary | ICD-10-CM

## 2018-10-09 DIAGNOSIS — G8929 Other chronic pain: Secondary | ICD-10-CM | POA: Insufficient documentation

## 2018-10-09 DIAGNOSIS — M25561 Pain in right knee: Secondary | ICD-10-CM

## 2018-10-09 DIAGNOSIS — M25512 Pain in left shoulder: Secondary | ICD-10-CM

## 2018-10-09 DIAGNOSIS — M898X1 Other specified disorders of bone, shoulder: Secondary | ICD-10-CM | POA: Insufficient documentation

## 2018-10-09 DIAGNOSIS — R768 Other specified abnormal immunological findings in serum: Secondary | ICD-10-CM | POA: Insufficient documentation

## 2018-10-09 DIAGNOSIS — M25562 Pain in left knee: Secondary | ICD-10-CM

## 2018-10-09 MED ORDER — VITAMIN D (ERGOCALCIFEROL) 1.25 MG (50000 UNIT) PO CAPS: 50000.0000 [IU] | ORAL_CAPSULE | ORAL | 1 refills | Status: DC

## 2018-10-29 ENCOUNTER — Encounter: Payer: Self-pay | Admitting: Family Medicine

## 2019-01-04 ENCOUNTER — Telehealth: Payer: Self-pay | Admitting: *Deleted

## 2019-01-04 NOTE — Telephone Encounter (Signed)
Patient advised that form have been faxed over to [facility].

## 2019-01-05 ENCOUNTER — Ambulatory Visit: Payer: Self-pay | Admitting: Family Medicine

## 2019-01-05 NOTE — Telephone Encounter (Signed)
Patient made MyChart appointment for heart fluttering and she feels it in her throat, also experiencing dizziness. Left Vm and will place her on callback list.

## 2019-01-05 NOTE — Telephone Encounter (Signed)
Patient called in and I advised that based on her mychart message, I will need to discuss the heart fluttering. She says it's not really her heart fluttering, but she feels like a spasm in her throat lasting a few seconds. She says this happens after eating and sometimes when she's hungry. She says this started a few months ago, but is becoming more frequent. She says it's not happening now, but it happened in the car earlier and she got a little dizzy that lasted a few seconds. She says that's why she called to be checked out. She denies CP, sweating, nausea, vomiting. According to protocol, see PCP within 3 days. Patient already has an appointment scheduled for Friday. Care advice given, patient verbalized understanding.   Reason for Disposition . [1] Palpitations AND [2] no improvement after using CARE ADVICE  Answer Assessment - Initial Assessment Questions 1. DESCRIPTION: "Please describe your heart rate or heart beat that you are having" (e.g., fast/slow, regular/irregular, skipped or extra beats, "palpitations")     Feels like a spasm towards my throat, nothing in my chest 2. ONSET: "When did it start?" (Minutes, hours or days)      A few months ago, but now more frequent 3. DURATION: "How long does it last" (e.g., seconds, minutes, hours)     Lasting a few seconds sometimes after eating or when hungry 4. PATTERN "Does it come and go, or has it been constant since it started?"  "Does it get worse with exertion?"   "Are you feeling it now?"     Come and go 5. TAP: "Using your hand, can you tap out what you are feeling on a chair or table in front of you, so that I can hear?" (Note: not all patients can do this)       N/A 6. HEART RATE: "Can you tell me your heart rate?" "How many beats in 15 seconds?"  (Note: not all patients can do this)       N/A 7. RECURRENT SYMPTOM: "Have you ever had this before?" If so, ask: "When was the last time?" and "What happened that time?"      Years ago 8.  CAUSE: "What do you think is causing the palpitations?"      I don't know 9. CARDIAC HISTORY: "Do you have any history of heart disease?" (e.g., heart attack, angina, bypass surgery, angioplasty, arrhythmia)      No 10. OTHER SYMPTOMS: "Do you have any other symptoms?" (e.g., dizziness, chest pain, sweating, difficulty breathing)       Dizziness today lasted a couple of seconds 11. PREGNANCY: "Is there any chance you are pregnant?" "When was your last menstrual period?"       No; on cycle now  Protocols used: HEART RATE AND HEARTBEAT QUESTIONS-A-AH

## 2019-01-07 ENCOUNTER — Encounter: Payer: Self-pay | Admitting: Family Medicine

## 2019-01-07 ENCOUNTER — Ambulatory Visit (INDEPENDENT_AMBULATORY_CARE_PROVIDER_SITE_OTHER): Payer: 59 | Admitting: Family Medicine

## 2019-01-07 VITALS — BP 125/80 | HR 62 | Temp 98.7°F | Ht 68.0 in | Wt 272.2 lb

## 2019-01-07 DIAGNOSIS — R42 Dizziness and giddiness: Secondary | ICD-10-CM | POA: Diagnosis not present

## 2019-01-07 DIAGNOSIS — R634 Abnormal weight loss: Secondary | ICD-10-CM

## 2019-01-07 DIAGNOSIS — R002 Palpitations: Secondary | ICD-10-CM

## 2019-01-07 DIAGNOSIS — Z6841 Body Mass Index (BMI) 40.0 and over, adult: Secondary | ICD-10-CM

## 2019-01-07 LAB — POCT CBC
GRANULOCYTE PERCENT: 52.4 % (ref 37–80)
HEMATOCRIT: 32.3 % (ref 29–41)
HEMOGLOBIN: 10.9 g/dL — AB (ref 11–14.6)
Lymph, poc: 1.6 (ref 0.6–3.4)
MCH: 29.8 pg (ref 27–31.2)
MCHC: 33.9 g/dL (ref 31.8–35.4)
MCV: 88.1 fL (ref 76–111)
MID (cbc): 0.3 (ref 0–0.9)
MPV: 8.5 fL (ref 0–99.8)
POC GRANULOCYTE: 2.1 (ref 2–6.9)
POC LYMPH PERCENT: 40.6 %L (ref 10–50)
POC MID %: 7 % (ref 0–12)
Platelet Count, POC: 220 10*3/uL (ref 142–424)
RBC: 3.67 M/uL — AB (ref 4.04–5.48)
RDW, POC: 12.7 %
WBC: 4 10*3/uL — AB (ref 4.6–10.2)

## 2019-01-07 LAB — GLUCOSE, POCT (MANUAL RESULT ENTRY): POC GLUCOSE: 91 mg/dL (ref 70–99)

## 2019-01-07 NOTE — Progress Notes (Signed)
Established Patient Office Visit  Subjective:  Patient ID: Heather [name], female    DOB: 1981/06/11  Age: 38 y.o. MRN: [id number]  CC:  Chief Complaint  Patient presents with  . heart flutters    more noticeable off and on, last 2 months  . Dizziness    off and on since november     HPI Heather [name] presents for    Patient reports that she has been having lightheadedness since November of 2019 She has a history of chest pain and had a stress echo She also had a full work up that was negative She gets a few seconds of dizziness that is intermittent and about once a week She reports that she gets heart palpitations a couple times a week She has normal menses - flow and frequency She had soda this weekend She typically drinks water and herbal teas that are caffeine free She is not sleeping well since she is back in school she is only getting 4-5 hours of sleep   Echo 09/10/2017 Study Conclusions  - Stress ECG conclusions: There were no stress arrhythmias or   conduction abnormalities. The stress ECG was negative for   ischemia. - Staged echo: There was no echocardiographic evidence for   stress-induced ischemia. - Impressions: Normal exercise stress echo  Impressions:  - Normal exercise stress echo  Past Medical History:  Diagnosis Date  . Anemia   . Carpal tunnel syndrome, left 06/2012  . Ganglion cyst 06/2012   left volar  . Herpes genitalia   . Postpartum care following vaginal delivery (11/6) 09/15/2016    Past Surgical History:  Procedure Laterality Date  . CARPAL TUNNEL RELEASE  06/28/2012   Procedure: CARPAL TUNNEL RELEASE;  Surgeon: Tennis Must, MD;  Location: [facility];  Service: Orthopedics;  Laterality: Left;  EXCISION VOLAR GANGLION CYST and carpal tunnel release left timeouts for ganglion cyst done at same time as timeouts for carpal tunnel with same staffs  . WISDOM TOOTH EXTRACTION      Family History  Problem Relation  Age of Onset  . Hypertension Mother   . Hypertension Father   . Hyperlipidemia Father   . Breast cancer Maternal Aunt        pt unsure  . Breast cancer Maternal Uncle   . Breast cancer Maternal Aunt        pt unsure    Social History   Socioeconomic History  . Marital status: Married    Spouse name: Not on file  . Number of children: Not on file  . Years of education: Not on file  . Highest education level: Not on file  Occupational History  . Not on file  Social Needs  . Financial resource strain: Not on file  . Food insecurity:    Worry: Not on file    Inability: Not on file  . Transportation needs:    Medical: Not on file    Non-medical: Not on file  Tobacco Use  . Smoking status: Never Smoker  . Smokeless tobacco: Never Used  Substance and Sexual Activity  . Alcohol use: No  . Drug use: No  . Sexual activity: Yes    Birth control/protection: None  Lifestyle  . Physical activity:    Days per week: Not on file    Minutes per session: Not on file  . Stress: Not on file  Relationships  . Social connections:    Talks on phone: Not on file  Gets together: Not on file    Attends religious service: Not on file    Active member of club or organization: Not on file    Attends meetings of clubs or organizations: Not on file    Relationship status: Not on file  . Intimate partner violence:    Fear of current or ex partner: Not on file    Emotionally abused: Not on file    Physically abused: Not on file    Forced sexual activity: Not on file  Other Topics Concern  . Not on file  Social History Narrative  . Not on file    Outpatient Medications Prior to Visit  Medication Sig Dispense Refill  . Vitamin D, Ergocalciferol, (DRISDOL) 1.25 MG (50000 UT) CAPS capsule Take 1 capsule (50,000 Units total) by mouth once a week. 12 capsule 1  . aspirin 325 MG tablet Take 325 mg by mouth daily.    Marland Kitchen b complex vitamins tablet Take 1 tablet by mouth daily.    . cetirizine  (ZYRTEC) 10 MG tablet Take 1 tablet (10 mg total) by mouth at bedtime. 30 tablet 11  . ibuprofen (ADVIL,MOTRIN) 600 MG tablet Take 1 tablet (600 mg total) by mouth every 8 (eight) hours as needed. 30 tablet 3  . Multiple Vitamin (MULTIVITAMIN) tablet Take 1 tablet by mouth daily.    Marland Kitchen triamcinolone (NASACORT) 55 MCG/ACT AERO nasal inhaler Place 2 sprays into the nose daily. 1 Inhaler 12   No facility-administered medications prior to visit.     No Known Allergies  ROS Review of Systems See hpi    Review of Systems  Constitutional: Negative for activity change, appetite change, chills and fever.  HENT: Negative for congestion, nosebleeds, trouble swallowing and voice change.   Respiratory: Negative for cough, shortness of breath and wheezing.   Gastrointestinal: Negative for diarrhea, nausea and vomiting.  Genitourinary: Negative for difficulty urinating, dysuria, flank pain and hematuria.  Musculoskeletal: Negative for back pain, joint swelling and neck pain.  Neurological: Negative for dizziness, speech difficulty, light-headedness and numbness.  See HPI. All other review of systems negative.   Objective:    Physical Exam  BP 125/80 (BP Location: Right Arm, Patient Position: Sitting, Cuff Size: Large)   Pulse 62   Temp 98.7 F (37.1 C) (Oral)   Ht 5' 8"  (1.727 m)   Wt 272 lb 3.2 oz (123.5 kg)   LMP 01/02/2019   SpO2 96%   BMI 41.39 kg/m  Wt Readings from Last 3 Encounters:  01/07/19 272 lb 3.2 oz (123.5 kg)  10/04/18 286 lb (129.7 kg)  04/10/18 293 lb 12.8 oz (133.3 kg)  Body mass index is 41.39 kg/m.  Physical Exam  Constitutional: Oriented to person, place, and time. Appears well-developed and well-nourished.  HENT:  Head: Normocephalic and atraumatic.  Eyes: Conjunctivae and EOM are normal.  Cardiovascular: Normal rate, regular rhythm, normal heart sounds and intact distal pulses.  No murmur heard. Pulmonary/Chest: Effort normal and breath sounds normal. No  stridor. No respiratory distress. Has no wheezes.  Neurological: Is alert and oriented to person, place, and time.  Skin: Skin is warm. Capillary refill takes less than 2 seconds.  Psychiatric: Has a normal mood and affect. Behavior is normal. Judgment and thought content normal.    Health Maintenance Due  Topic Date Due  . INFLUENZA VACCINE  06/10/2018    There are no preventive care reminders to display for this patient.  Lab Results  Component Value Date  TSH 1.160 10/04/2018   Lab Results  Component Value Date   WBC 5.8 10/04/2018   HGB 12.1 10/04/2018   HCT 36.2 10/04/2018   MCV 87.0 10/04/2018   PLT 280 08/10/2017   Lab Results  Component Value Date   NA 139 10/04/2018   K 4.4 10/04/2018   CO2 20 10/04/2018   GLUCOSE 81 10/04/2018   BUN 12 10/04/2018   CREATININE 0.84 10/04/2018   BILITOT 0.3 10/04/2018   ALKPHOS 80 10/04/2018   AST 15 10/04/2018   ALT 14 10/04/2018   PROT 7.2 10/04/2018   ALBUMIN 4.2 10/04/2018   CALCIUM 9.9 10/04/2018   Lab Results  Component Value Date   CHOL 155 08/10/2017   Lab Results  Component Value Date   HDL 56 08/10/2017   Lab Results  Component Value Date   LDLCALC 90 08/10/2017   Lab Results  Component Value Date   TRIG 46 08/10/2017   Lab Results  Component Value Date   CHOLHDL 2.8 08/10/2017   Lab Results  Component Value Date   HGBA1C 5.6 10/04/2018      Assessment & Plan:   Problem List Items Addressed This Visit    None    Visit Diagnoses    Palpitations    -  Primary   Relevant Orders   EKG 12-Lead (Completed)   POCT CBC   POCT urinalysis dipstick   POCT glucose (manual entry)   CMP14+EGFR   Vitamin B12   Holter monitor - 48 hour   Dizziness       Relevant Orders   POCT CBC   POCT urinalysis dipstick   POCT glucose (manual entry)   CMP14+EGFR   Vitamin B12   Holter monitor - 48 hour    Dizziness and Palpitations Reviewed previous ECHO and ECG as well as labs Discussed risk  factors Will check some labs  Plan to get a Holter monitor  Weight loss Obesity Dietary modification and intermittent fasting for intentional weight loss which is working    No orders of the defined types were placed in this encounter.   Follow-up: No follow-ups on file.    Forrest Moron, MD

## 2019-01-07 NOTE — Patient Instructions (Addendum)
If you have lab work done today you will be contacted with your lab results within the next 2 weeks.  If you have not heard from us then please contact us. The fastest way to get your results is to register for My Chart.   IF you received an x-ray today, you will receive an invoice from [facility]. Please contact [facility] at [phone] with questions or concerns regarding your invoice.   IF you received labwork today, you will receive an invoice from [facility]. Please contact [facility] at [phone] with questions or concerns regarding your invoice.   Our billing staff will not be able to assist you with questions regarding bills from these companies.  You will be contacted with the lab results as soon as they are available. The fastest way to get your results is to activate your My Chart account. Instructions are located on the last page of this paperwork. If you have not heard from us regarding the results in 2 weeks, please contact this office.      Ambulatory Cardiac Monitoring An ambulatory cardiac monitor is a small recording device that is used to detect abnormal heart rhythms (arrhythmias). Most monitors are connected by wires to flat, sticky disks (electrodes) that are then attached to your chest. You may need to wear a monitor if you have had symptoms such as:  Fast heartbeats (palpitations).  Dizziness.  Fainting or light-headedness.  Unexplained weakness.  Shortness of breath. There are several types of monitors. Some common monitors include:  Holter monitor. This records your heart rhythm continuously, usually for 24-48 hours.  Event (episodic) monitor. This monitor has a symptoms button, and when pushed, it will begin recording. You need to activate this monitor to record when you have a heart-related symptom.  Automatic detection monitor. This monitor will begin recording when it detects an abnormal heartbeat. What are the  risks? Generally, these devices are safe to use. However, it is possible that the skin under the electrodes will become irritated. How to prepare for monitoring Your health care provider will prepare your chest for the electrode placement and show you how to use the monitor.  Do not apply lotions to your chest before monitoring.  Follow directions on how to care for the monitor, and how to return the monitor when the testing period is complete. How to use your cardiac monitor  Follow directions about how long to wear the monitor, and if you can take the monitor off in order to shower or bathe. ? Do not let the monitor get wet. ? Do not bathe, swim, or use a hot tub while wearing the monitor.  Keep your skin clean. Do not put body lotion or moisturizer on your chest.  Change the electrodes as told by your health care provider, or any time they stop sticking to your skin. You may need to use medical tape to keep them on.  Try to put the electrodes in slightly different places on your chest to help prevent skin irritation. Follow directions from your health care provider about where to place the electrodes.  Make sure the monitor is safely clipped to your clothing or in a location close to your body as recommended by your health care provider.  If your monitor has a symptoms button, press the button to mark an event as soon as you feel a heart-related symptom, such as: ? Dizziness. ? Weakness. ? Light-headedness. ? Palpitations. ? Thumping or pounding in your chest. ?  Shortness of breath. ? Unexplained weakness.  Keep a diary of your activities, such as walking, doing chores, and taking medicine. It is very important to note what you were doing when you pushed the button to record your symptoms. This will help your health care provider determine what might be contributing to your symptoms.  Send the recorded information as recommended by your health care provider. It may take some time  for your health care provider to process the results.  Change the batteries as told by your health care provider.  Keep electronic devices away from your monitor. These include: ? Tablets. ? MP3 players. ? Cell phones.  While wearing your monitor you should avoid: ? Electric blankets. ? Firefighter. ? Electric toothbrushes. ? Microwave ovens. ? Magnets. ? Metal detectors. Get help right away if:  You have chest pain.  You have shortness of breath or extreme difficulty breathing.  You develop a very fast heartbeat that does not get better.  You develop dizziness that does not go away.  You faint or constantly feel like you are about to faint. Summary  An ambulatory cardiac monitor is a small recording device that is used to detect abnormal heart rhythms (arrhythmias).  Make sure you understand how to send the information from the monitor to your health care provider.  It is important to press the button on the monitor when you have any heart-related symptoms.  Keep a diary of your activities, such as walking, doing chores, and taking medicine. It is very important to note what you were doing when you pushed the button to record your symptoms. This will help your health care provider learn what might be causing your symptoms. This information is not intended to replace advice given to you by your health care provider. Make sure you discuss any questions you have with your health care provider. Document Released: 08/05/2008 Document Revised: 08/12/2017 Document Reviewed: 10/11/2016 Elsevier Interactive Patient Education  2019 [employer]  Premature Ventricular Contraction  A premature ventricular contraction (PVC) is a common kind of irregular heartbeat (arrhythmia). These contractions are extra heartbeats that start in the ventricles of the heart and occur too early in the normal sequence. During the PVC, the heart's normal electrical pathway is not used, so the beat is  shorter and less effective. In most cases, these contractions come and go and do not require treatment. What are the causes? Common causes of the condition include:  Smoking.  Drinking alcohol.  Certain medicines.  Some illegal drugs.  Stress.  Caffeine. Certain medical conditions can also cause PVCs:  Heart failure.  Heart attack, or coronary artery disease.  Heart valve problems.  Changes in minerals in the blood (electrolytes).  Low blood oxygen levels or high carbon dioxide levels. In many cases, the cause of this condition is not known. What are the signs or symptoms? The main symptom of this condition is fast or skipped heartbeats (palpitations). Other symptoms include:  Chest pain.  Shortness of breath.  Feeling tired.  Dizziness.  Difficulty exercising. In some cases, there are no symptoms. How is this diagnosed? This condition may be diagnosed based on:  Your medical history.  A physical exam. During the exam, the health care provider will check for irregular heartbeats.  Tests, such as: ? An ECG (electrocardiogram) to monitor the electrical activity of your heart. ? Ambulatory cardiac monitor. This device records your heartbeats for 24 hours or more. ? Stress tests to see how exercise affects your heart  rhythm and blood supply. ? Echocardiogram. This test uses sound waves (ultrasound) to produce an image of your heart. ? Electrophysiology study (EPS). This test checks for electrical problems in your heart. How is this treated? Treatment for this condition depends on any underlying conditions, the type of PVCs that you are having, and how much the symptoms are interfering with your daily life. Possible treatments include:  Avoiding things that cause premature contractions (triggers). These include caffeine and alcohol.  Taking medicines if symptoms are severe or if the extra heartbeats are frequent.  Getting treatment for underlying conditions  that cause PVCs.  Having an implantable cardioverter defibrillator (ICD), if you are at risk for a serious arrhythmia. The ICD is a small device that is inserted into your chest to monitor your heartbeat. When it senses an irregular heartbeat, it sends a shock to bring the heartbeat back to normal.  Having a procedure to destroy the portion of the heart tissue that sends out abnormal signals (catheter ablation). In some cases, no treatment is required. Follow these instructions at home: Alcohol use   Do not drink alcohol if: ? Your health care provider tells you not to drink. ? You are pregnant, may be pregnant, or are planning to become pregnant. ? Alcohol triggers your episodes.  If you drink alcohol, limit how much you have. You may drink: ? 0-1 drink a day for women. ? 0-2 drinks a day for men.  Be aware of how much alcohol is in your drink. In the [place] one drink equals one typical bottle of beer (12 oz), one-half glass of wine (5 oz), or one shot of hard liquor (1 oz). General instructions  Do not use any products that contain nicotine or tobacco, such as cigarettes and e-cigarettes. If you need help quitting, ask your health care provider.  Find healthy ways to manage stress. Avoid stressful situations when possible.  Exercise regularly. Ask your health care provider what type of exercise is safe for you.  Try to get at least 7-9 hours of sleep each night, or as much as recommended by your health care provider.  If caffeine triggers episodes of PVC, do not eat, drink, or use anything with caffeine in it.  Do not use illegal drugs.  Take over-the-counter and prescription medicines only as told by your health care provider.  Keep all follow-up visits as told by your health care provider. This is important. Contact a health care provider if you:  Feel palpitations. Get help right away if you:  Have chest pain.  Have shortness of breath.  Have sweating for no  reason.  Have nausea and vomiting.  Become light-headed or you faint. Summary  A premature ventricular contraction (PVC) is a common kind of irregular heartbeat (arrhythmia).  In most cases, these contractions come and go and do not require treatment.  You may need to wear an ambulatory cardiac monitor. This records your heartbeats for 24 hours or more.  Treatment depends on any underlying conditions, the type of PVCs that you are having, and how much the symptoms are interfering with your daily life. This information is not intended to replace advice given to you by your health care provider. Make sure you discuss any questions you have with your health care provider. Document Released: 06/13/2004 Document Revised: 06/11/2018 Document Reviewed: 12/15/2017 Elsevier Interactive Patient Education  2019 [employer].

## 2019-01-08 LAB — CMP14+EGFR
A/G RATIO: 2.1 (ref 1.2–2.2)
ALK PHOS: 68 IU/L (ref 39–117)
ALT: 57 IU/L — AB (ref 0–32)
AST: 82 IU/L — AB (ref 0–40)
Albumin: 4.7 g/dL (ref 3.8–4.8)
BILIRUBIN TOTAL: 0.6 mg/dL (ref 0.0–1.2)
BUN/Creatinine Ratio: 9 (ref 9–23)
BUN: 12 mg/dL (ref 6–20)
CHLORIDE: 94 mmol/L — AB (ref 96–106)
CO2: 27 mmol/L (ref 20–29)
Calcium: 9.5 mg/dL (ref 8.7–10.2)
Creatinine, Ser: 1.27 mg/dL — ABNORMAL HIGH (ref 0.57–1.00)
GFR calc Af Amer: 63 mL/min/{1.73_m2} (ref >59)
GFR, EST NON AFRICAN AMERICAN: 54 mL/min/{1.73_m2} — AB (ref >59)
GLOBULIN, TOTAL: 2.2 g/dL (ref 1.5–4.5)
Glucose: 115 mg/dL — ABNORMAL HIGH (ref 65–99)
POTASSIUM: 3.5 mmol/L (ref 3.5–5.2)
SODIUM: 140 mmol/L (ref 134–144)
Total Protein: 6.9 g/dL (ref 6.0–8.5)

## 2019-01-08 LAB — VITAMIN B12: VITAMIN B 12: 374 pg/mL (ref 232–1245)

## 2019-01-13 ENCOUNTER — Encounter: Payer: Self-pay | Admitting: Family Medicine

## 2019-01-19 ENCOUNTER — Encounter: Payer: Self-pay | Admitting: Family Medicine

## 2019-01-24 ENCOUNTER — Ambulatory Visit: Payer: Self-pay

## 2019-01-24 NOTE — Telephone Encounter (Signed)
Patient called in with c/o "dizziness." She says "the dizziness has been going on for a week, but is getting worse. I feel woozy headed and when I feel it, I drink water and lay down to relax. Once I do that, I'm fine. I am feeling it now, it's worse when I move my head. I had this before in November and was told I had fluid in my ears. My ears do feel like there's pressure in them. Sometimes I feel nervous and jittery, but not now." I asked about other symptoms, she denies. According to protocol, see PCP within 3 days, no availability with PCP until Friday, 01/28/19, no availability with other providers except for Same Day slots. She says she will take Friday appointment. Scheduled for 01/28/19 at 1400 with Dr. Creta Levin, care advice given, patient verbalized understanding.  Reason for Disposition . [1] MODERATE dizziness (e.g., interferes with normal activities) AND [2] has been evaluated by physician for this  Answer Assessment - Initial Assessment Questions 1. DESCRIPTION: "Describe your dizziness."     Woozy headed 2. LIGHTHEADED: "Do you feel lightheaded?" (e.g., somewhat faint, woozy, weak upon standing)     Yes 3. VERTIGO: "Do you feel like either you or the room is spinning or tilting?" (i.e. vertigo)     No 4. SEVERITY: "How bad is it?"  "Do you feel like you are going to faint?" "Can you stand and walk?"   - MILD - walking normally   - MODERATE - interferes with normal activities (e.g., work, school)    - SEVERE - unable to stand, requires support to walk, feels like passing out now.      Mild-Moderate at times have to lay down and relax 5. ONSET:  "When did the dizziness begin?"     Past week to this extreme 6. AGGRAVATING FACTORS: "Does anything make it worse?" (e.g., standing, change in head position)     Changing position of head 7. HEART RATE: "Can you tell me your heart rate?" "How many beats in 15 seconds?"  (Note: not all patients can do this)       No 8. CAUSE: "What do you  think is causing the dizziness?"     I don't know; drinking water makes it better 9. RECURRENT SYMPTOM: "Have you had dizziness before?" If so, ask: "When was the last time?" "What happened that time?"     Yes in November; had fluid in my ears 10. OTHER SYMPTOMS: "Do you have any other symptoms?" (e.g., fever, chest pain, vomiting, diarrhea, bleeding)       Ear pressure 11. PREGNANCY: "Is there any chance you are pregnant?" "When was your last menstrual period?"       No, LMP 01/11/19  Protocols used: DIZZINESS [facility]

## 2019-01-25 ENCOUNTER — Other Ambulatory Visit: Payer: Self-pay

## 2019-01-25 ENCOUNTER — Ambulatory Visit (INDEPENDENT_AMBULATORY_CARE_PROVIDER_SITE_OTHER): Payer: 59

## 2019-01-25 DIAGNOSIS — R002 Palpitations: Secondary | ICD-10-CM | POA: Diagnosis not present

## 2019-01-25 DIAGNOSIS — R42 Dizziness and giddiness: Secondary | ICD-10-CM

## 2019-01-28 ENCOUNTER — Ambulatory Visit: Payer: 59 | Admitting: Family Medicine

## 2019-02-01 ENCOUNTER — Ambulatory Visit (INDEPENDENT_AMBULATORY_CARE_PROVIDER_SITE_OTHER): Payer: 59 | Admitting: Family Medicine

## 2019-02-01 ENCOUNTER — Other Ambulatory Visit: Payer: Self-pay

## 2019-02-01 ENCOUNTER — Encounter: Payer: Self-pay | Admitting: Neurology

## 2019-02-01 ENCOUNTER — Encounter: Payer: Self-pay | Admitting: Family Medicine

## 2019-02-01 VITALS — BP 141/88 | HR 84 | Temp 99.1°F | Resp 18 | Ht 68.0 in | Wt 263.0 lb

## 2019-02-01 DIAGNOSIS — E559 Vitamin D deficiency, unspecified: Secondary | ICD-10-CM | POA: Diagnosis not present

## 2019-02-01 DIAGNOSIS — R002 Palpitations: Secondary | ICD-10-CM

## 2019-02-01 DIAGNOSIS — R42 Dizziness and giddiness: Secondary | ICD-10-CM

## 2019-02-01 DIAGNOSIS — D508 Other iron deficiency anemias: Secondary | ICD-10-CM | POA: Diagnosis not present

## 2019-02-01 DIAGNOSIS — H6982 Other specified disorders of Eustachian tube, left ear: Secondary | ICD-10-CM

## 2019-02-01 LAB — POCT CBC
GRANULOCYTE PERCENT: 58.1 % (ref 37–80)
HCT, POC: 34.7 % (ref 29–41)
Hemoglobin: 11.8 g/dL (ref 11–14.6)
LYMPH, POC: 1.9 — AB (ref 0.6–3.4)
MCH, POC: 29.6 pg (ref 27–31.2)
MCHC: 34.1 g/dL (ref 31.8–35.4)
MCV: 86.7 fL (ref 76–111)
MID (cbc): 0.3 — AB (ref 0–0.9)
MPV: 8.7 fL (ref 0–99.8)
PLATELET COUNT, POC: 262 10*3/uL (ref 142–424)
POC Granulocyte: 3 (ref 2–6.9)
POC LYMPH PERCENT: 35.9 %L (ref 10–50)
POC MID %: 6 %M (ref 0–12)
RBC: 4 M/uL — AB (ref 4.04–5.48)
RDW, POC: 13 %
WBC: 5.2 10*3/uL (ref 4.6–10.2)

## 2019-02-01 MED ORDER — MECLIZINE HCL 25 MG PO TABS: 25.0000 mg | ORAL_TABLET | Freq: Three times a day (TID) | ORAL | 3 refills | Status: DC | PRN

## 2019-02-01 NOTE — Patient Instructions (Addendum)
Wt Readings from Last 3 Encounters:  02/01/19 263 lb (119.3 kg)  01/07/19 272 lb 3.2 oz (123.5 kg)  10/04/18 286 lb (129.7 kg)     Increase nasal spray to twice a day (flonase or nasocort) Add on zyrtec once daily   Your iron levels are good   If you have lab work done today you will be contacted with your lab results within the next 2 weeks.  If you have not heard from [place] then please contact us. The fastest way to get your results is to register for My Chart.   IF you received an x-ray today, you will receive an invoice from [facility]. Please contact [facility] at [phone] with questions or concerns regarding your invoice.   IF you received labwork today, you will receive an invoice from [facility]. Please contact [facility] at [phone] with questions or concerns regarding your invoice.   Our billing staff will not be able to assist you with questions regarding bills from these companies.  You will be contacted with the lab results as soon as they are available. The fastest way to get your results is to activate your My Chart account. Instructions are located on the last page of this paperwork. If you have not heard from [place] regarding the results in 2 weeks, please contact this office.     Dizziness Dizziness is a common problem. It is a feeling of unsteadiness or light-headedness. You may feel like you are about to faint. Dizziness can lead to injury if you stumble or fall. Anyone can become dizzy, but dizziness is more common in older adults. This condition can be caused by a number of things, including medicines, dehydration, or illness. Follow these instructions at home: Eating and drinking  Drink enough fluid to keep your urine clear or pale yellow. This helps to keep you from becoming dehydrated. Try to drink more clear fluids, such as water.  Do not drink alcohol.  Limit your caffeine intake if told to do so by your health care provider. Check  ingredients and nutrition facts to see if a food or beverage contains caffeine.  Limit your salt (sodium) intake if told to do so by your health care provider. Check ingredients and nutrition facts to see if a food or beverage contains sodium. Activity  Avoid making quick movements. ? Rise slowly from chairs and steady yourself until you feel okay. ? In the morning, first sit up on the side of the bed. When you feel okay, stand slowly while you hold onto something until you know that your balance is fine.  If you need to stand in one place for a long time, move your legs often. Tighten and relax the muscles in your legs while you are standing.  Do not drive or use heavy machinery if you feel dizzy.  Avoid bending down if you feel dizzy. Place items in your home so that they are easy for you to reach without leaning over. Lifestyle  Do not use any products that contain nicotine or tobacco, such as cigarettes and e-cigarettes. If you need help quitting, ask your health care provider.  Try to reduce your stress level by using methods such as yoga or meditation. Talk with your health care provider if you need help to manage your stress. General instructions  Watch your dizziness for any changes.  Take over-the-counter and prescription medicines only as told by your health care provider. Talk with your health care provider if you think that  your dizziness is caused by a medicine that you are taking.  Tell a friend or a family member that you are feeling dizzy. If he or she notices any changes in your behavior, have this person call your health care provider.  Keep all follow-up visits as told by your health care provider. This is important. Contact a health care provider if:  Your dizziness does not go away.  Your dizziness or light-headedness gets worse.  You feel nauseous.  You have reduced hearing.  You have new symptoms.  You are unsteady on your feet or you feel like the room  is spinning. Get help right away if:  You vomit or have diarrhea and are unable to eat or drink anything.  You have problems talking, walking, swallowing, or using your arms, hands, or legs.  You feel generally weak.  You are not thinking clearly or you have trouble forming sentences. It may take a friend or family member to notice this.  You have chest pain, abdominal pain, shortness of breath, or sweating.  Your vision changes.  You have any bleeding.  You have a severe headache.  You have neck pain or a stiff neck.  You have a fever. These symptoms may represent a serious problem that is an emergency. Do not wait to see if the symptoms will go away. Get medical help right away. Call your local emergency services (911 in the [place] Do not drive yourself to the hospital. Summary  Dizziness is a feeling of unsteadiness or light-headedness. This condition can be caused by a number of things, including medicines, dehydration, or illness.  Anyone can become dizzy, but dizziness is more common in older adults.  Drink enough fluid to keep your urine clear or pale yellow. Do not drink alcohol.  Avoid making quick movements if you feel dizzy. Monitor your dizziness for any changes. This information is not intended to replace advice given to you by your health care provider. Make sure you discuss any questions you have with your health care provider. Document Released: 04/22/2001 Document Revised: 11/29/2016 Document Reviewed: 11/29/2016 Elsevier Interactive Patient Education  2019 [employer].

## 2019-02-01 NOTE — Progress Notes (Signed)
Established Patient Office Visit  Subjective:  Patient ID: Heather [name], female    DOB: [birth year]  Age: 38 y.o. MRN: [id number]  CC:  Chief Complaint  Patient presents with  . Dizziness    x 2 weeks; states the dizziness comes and goes since November 2019; no activity makes it better or worse    HPI Heather [name] presents for patient is here for [facility] for dizziness  She states that she has been dealing with dizziness since her office visit 10/05/2018 She states that she gets daily symptoms that can last for hours It does not happen with walking or climbing stairs Seems to happen when she is looking at the phone or laying down on her side or looking at the computer monitor as well as reclining in bed She reports that she was treated for eustachian tube dysfunction with nasocort but zyrtec She states that she has been feeling like her ears are congested She is only using the nasal spray She takes herbals like tumeric, roots and teas  Palpitations She had a holter monitor She takes roots but drinks plenty of water She denies any other stimulants She is breastfeeding her child   Past Medical History:  Diagnosis Date  . Anemia   . Carpal tunnel syndrome, left 06/2012  . Ganglion cyst 06/2012   left volar  . Herpes genitalia   . Postpartum care following vaginal delivery (11/6) 09/15/2016    Past Surgical History:  Procedure Laterality Date  . CARPAL TUNNEL RELEASE  06/28/2012   Procedure: CARPAL TUNNEL RELEASE;  Surgeon: Tennis Must, MD;  Location: [facility];  Service: Orthopedics;  Laterality: Left;  EXCISION VOLAR GANGLION CYST and carpal tunnel release left timeouts for ganglion cyst done at same time as timeouts for carpal tunnel with same staffs  . WISDOM TOOTH EXTRACTION      Family History  Problem Relation Age of Onset  . Hypertension Mother   . Hypertension Father   . Hyperlipidemia Father   . Breast cancer Maternal Aunt        pt  unsure  . Breast cancer Maternal Uncle   . Breast cancer Maternal Aunt        pt unsure    Social History   Socioeconomic History  . Marital status: Married    Spouse name: Not on file  . Number of children: Not on file  . Years of education: Not on file  . Highest education level: Not on file  Occupational History  . Not on file  Social Needs  . Financial resource strain: Not on file  . Food insecurity:    Worry: Not on file    Inability: Not on file  . Transportation needs:    Medical: Not on file    Non-medical: Not on file  Tobacco Use  . Smoking status: Never Smoker  . Smokeless tobacco: Never Used  Substance and Sexual Activity  . Alcohol use: No  . Drug use: No  . Sexual activity: Yes    Birth control/protection: None  Lifestyle  . Physical activity:    Days per week: Not on file    Minutes per session: Not on file  . Stress: Not on file  Relationships  . Social connections:    Talks on phone: Not on file    Gets together: Not on file    Attends religious service: Not on file    Active member of club or organization: Not on file  Attends meetings of clubs or organizations: Not on file    Relationship status: Not on file  . Intimate partner violence:    Fear of current or ex partner: Not on file    Emotionally abused: Not on file    Physically abused: Not on file    Forced sexual activity: Not on file  Other Topics Concern  . Not on file  Social History Narrative  . Not on file    Outpatient Medications Prior to Visit  Medication Sig Dispense Refill  . valACYclovir (VALTREX) 500 MG tablet TK 1 T PO D    . Vitamin D, Ergocalciferol, (DRISDOL) 1.25 MG (50000 UT) CAPS capsule Take 1 capsule (50,000 Units total) by mouth once a week. 12 capsule 1   No facility-administered medications prior to visit.     No Known Allergies  ROS Review of Systems See hpi Review of Systems  Constitutional: Negative for activity change, appetite change, chills  and fever.  HENT: Negative for congestion, nosebleeds, trouble swallowing and voice change.   Respiratory: Negative for cough, shortness of breath and wheezing.   Gastrointestinal: Negative for diarrhea, nausea and vomiting.  Genitourinary: Negative for difficulty urinating, dysuria, flank pain and hematuria.  Musculoskeletal: Negative for back pain, joint swelling and neck pain.  Neurological: see hpi  See HPI. All other review of systems negative.     Objective:    Physical Exam  BP (!) 141/88   Pulse 84   Temp 99.1 F (37.3 C) (Oral)   Resp 18   Ht 5' 8"  (1.727 m)   Wt 263 lb (119.3 kg)   LMP 01/02/2019   SpO2 98%   BMI 39.99 kg/m  Wt Readings from Last 3 Encounters:  02/01/19 263 lb (119.3 kg)  01/07/19 272 lb 3.2 oz (123.5 kg)  10/04/18 286 lb (129.7 kg)   Physical Exam  Constitutional: Oriented to person, place, and time. Appears well-developed and well-nourished.  HENT:  Head: Normocephalic and atraumatic.  Eyes: Conjunctivae and EOM are normal.  Ears: TM without  Cardiovascular: Normal rate, regular rhythm, normal heart sounds and intact distal pulses.  No murmur heard. Pulmonary/Chest: Effort normal and breath sounds normal. No stridor. No respiratory distress. Has no wheezes.  Neurological: Is alert and oriented to person, place, and time.  Skin: Skin is warm. Capillary refill takes less than 2 seconds.  Psychiatric: Has a normal mood and affect. Behavior is normal. Judgment and thought content normal.    Health Maintenance Due  Topic Date Due  . INFLUENZA VACCINE  06/10/2018    There are no preventive care reminders to display for this patient.  Lab Results  Component Value Date   TSH 1.160 10/04/2018   Lab Results  Component Value Date   WBC 4.0 (A) 01/07/2019   HGB 10.9 (A) 01/07/2019   HCT 32.3 01/07/2019   MCV 88.1 01/07/2019   PLT 280 08/10/2017   Lab Results  Component Value Date   NA 140 01/07/2019   K 3.5 01/07/2019   CO2 27  01/07/2019   GLUCOSE 115 (H) 01/07/2019   BUN 12 01/07/2019   CREATININE 1.27 (H) 01/07/2019   BILITOT 0.6 01/07/2019   ALKPHOS 68 01/07/2019   AST 82 (H) 01/07/2019   ALT 57 (H) 01/07/2019   PROT 6.9 01/07/2019   ALBUMIN 4.7 01/07/2019   CALCIUM 9.5 01/07/2019   Lab Results  Component Value Date   CHOL 155 08/10/2017   Lab Results  Component Value Date  HDL 56 08/10/2017   Lab Results  Component Value Date   LDLCALC 90 08/10/2017   Lab Results  Component Value Date   TRIG 46 08/10/2017   Lab Results  Component Value Date   CHOLHDL 2.8 08/10/2017   Lab Results  Component Value Date   HGBA1C 5.6 10/04/2018      Assessment & Plan:   Problem List Items Addressed This Visit      Other   Hypovitaminosis D  - will assess    Relevant Orders   VITAMIN D 25 Hydroxy (Vit-D Deficiency, Fractures)    Other Visit Diagnoses    Palpitations    -  Primary Advised pt to stop taking herbal for now  Cut back on caffeine and increase hydration   Relevant Orders   CMP14+EGFR   Dizziness    -  Would normally refer to vestibular rehab but with the pandemic much of the PT is closed Advised pt to follow up with Neurology  But start the antivert in the interim   Relevant Medications   meclizine (ANTIVERT) 25 MG tablet   Other Relevant Orders   CMP14+EGFR   Ambulatory referral to Neurology   Other iron deficiency anemia    - no deficiency noted today  Take a daily MV with iron otc    Relevant Orders   POCT CBC   Iron, TIBC and Ferritin Panel    Eustachian tube dysfunction - increase flonase TO BID and add zyrtec   Meds ordered this encounter  Medications  . meclizine (ANTIVERT) 25 MG tablet    Sig: Take 1 tablet (25 mg total) by mouth 3 (three) times daily as needed for dizziness.    Dispense:  30 tablet    Refill:  3    Follow-up: No follow-ups on file.    Forrest Moron, MD

## 2019-02-02 LAB — CMP14+EGFR
ALK PHOS: 58 IU/L (ref 39–117)
ALT: 9 IU/L (ref 0–32)
AST: 16 IU/L (ref 0–40)
Albumin/Globulin Ratio: 1.6 (ref 1.2–2.2)
Albumin: 4.2 g/dL (ref 3.8–4.8)
BUN/Creatinine Ratio: 10 (ref 9–23)
BUN: 8 mg/dL (ref 6–20)
Bilirubin Total: 0.5 mg/dL (ref 0.0–1.2)
CHLORIDE: 104 mmol/L (ref 96–106)
CO2: 21 mmol/L (ref 20–29)
Calcium: 9.6 mg/dL (ref 8.7–10.2)
Creatinine, Ser: 0.77 mg/dL (ref 0.57–1.00)
GFR calc non Af Amer: 100 mL/min/{1.73_m2} (ref >59)
GFR, EST AFRICAN AMERICAN: 115 mL/min/{1.73_m2} (ref >59)
GLUCOSE: 75 mg/dL (ref 65–99)
Globulin, Total: 2.6 g/dL (ref 1.5–4.5)
Potassium: 4.2 mmol/L (ref 3.5–5.2)
Sodium: 141 mmol/L (ref 134–144)
TOTAL PROTEIN: 6.8 g/dL (ref 6.0–8.5)

## 2019-02-02 LAB — VITAMIN D 25 HYDROXY (VIT D DEFICIENCY, FRACTURES): Vit D, 25-Hydroxy: 39.6 ng/mL (ref 30.0–100.0)

## 2019-02-02 LAB — IRON,TIBC AND FERRITIN PANEL
Ferritin: 82 ng/mL (ref 15–150)
IRON: 106 ug/dL (ref 27–159)
Iron Saturation: 38 % (ref 15–55)
TIBC: 282 ug/dL (ref 250–450)
UIBC: 176 ug/dL (ref 131–425)

## 2019-02-15 ENCOUNTER — Telehealth (INDEPENDENT_AMBULATORY_CARE_PROVIDER_SITE_OTHER): Payer: 59 | Admitting: Family Medicine

## 2019-02-15 DIAGNOSIS — R42 Dizziness and giddiness: Secondary | ICD-10-CM | POA: Diagnosis not present

## 2019-02-15 DIAGNOSIS — R002 Palpitations: Secondary | ICD-10-CM | POA: Diagnosis not present

## 2019-02-15 DIAGNOSIS — F411 Generalized anxiety disorder: Secondary | ICD-10-CM | POA: Diagnosis not present

## 2019-02-15 NOTE — Patient Instructions (Signed)
° ° ° °  If you have lab work done today you will be contacted with your lab results within the next 2 weeks.  If you have not heard from us then please contact us. The fastest way to get your results is to register for My Chart. ° ° °IF you received an x-ray today, you will receive an invoice from [facility]. Please contact [facility] at [phone] with questions or concerns regarding your invoice.  ° °IF you received labwork today, you will receive an invoice from [facility]. Please contact [facility] at [phone] with questions or concerns regarding your invoice.  ° °Our billing staff will not be able to assist you with questions regarding bills from these companies. ° °You will be contacted with the lab results as soon as they are available. The fastest way to get your results is to activate your My Chart account. Instructions are located on the last page of this paperwork. If you have not heard from us regarding the results in 2 weeks, please contact this office. °  ° ° ° °

## 2019-02-15 NOTE — Progress Notes (Signed)
CC- follow up for palpatation. The meclizine has been making the palpatation better. Did do a Holter monitor for 48 hr. The result are in the chart.

## 2019-02-15 NOTE — Progress Notes (Signed)
Telemedicine Encounter- SOAP NOTE Established Patient  This telephone encounter was conducted with the patient's (or proxy's) verbal consent via audio telecommunications: yes/no: Yes Patient was instructed to have this encounter in a suitably private space; and to only have persons present to whom they give permission to participate. In addition, patient identity was confirmed by use of name plus two identifiers (DOB and address).  I discussed the limitations, risks, security and privacy concerns of performing an evaluation and management service by telephone and the availability of in person appointments. I also discussed with the patient that there may be a patient responsible charge related to this service. The patient expressed understanding and agreed to proceed.  I spent a total of TIME; 0 MIN TO 60 MIN: 15 minutes talking with the patient or their proxy.  CC: palpitations  Subjective   Heather [name] is a 38 y.o. established patient. Telephone visit today for  HPI Dizziness and palpitations Reports that since taking the meclizine the palpitations are not as pronounced She states that she feels better They last the same amount of time when they occur but there are less frequent  She is taking the meclizine and zyrtec She has seasonal allergies which seems to make her dizziness worse She has an appt with Neurology in June 2020.    Anxiety She states that the cough from postnasal drip makes her very anxious about the coronavirus She is washing her hands and practicing social distancing She denies panic attacks  Patient Active Problem List   Diagnosis Date Noted  . Rheumatoid factor positive 10/09/2018  . Chronic pain of both knees 10/09/2018  . Clavicle pain 10/09/2018  . Chronic left shoulder pain 10/09/2018  . Chronic left-sided thoracic back pain 10/09/2018  . Class 3 severe obesity due to excess calories with serious comorbidity and body mass index (BMI) of 40.0 to  44.9 in adult (HCC) 10/09/2018  . Arthralgia of multiple joints 10/09/2018  . Hypovitaminosis D 08/11/2017  . Right carpal tunnel syndrome 03/06/2017  . Left carpal tunnel syndrome 03/06/2017    Past Medical History:  Diagnosis Date  . Anemia   . Carpal tunnel syndrome, left 06/2012  . Ganglion cyst 06/2012   left volar  . Herpes genitalia   . Postpartum care following vaginal delivery (11/6) 09/15/2016    Current Outpatient Medications  Medication Sig Dispense Refill  . meclizine (ANTIVERT) 25 MG tablet Take 1 tablet (25 mg total) by mouth 3 (three) times daily as needed for dizziness. 30 tablet 3  . cetirizine (ZYRTEC) 10 MG tablet Take 10 mg by mouth daily.    . valACYclovir (VALTREX) 500 MG tablet TK 1 T PO D     No current facility-administered medications for this visit.     No Known Allergies  Social History   Socioeconomic History  . Marital status: Married    Spouse name: Not on file  . Number of children: Not on file  . Years of education: Not on file  . Highest education level: Not on file  Occupational History  . Not on file  Social Needs  . Financial resource strain: Not on file  . Food insecurity:    Worry: Not on file    Inability: Not on file  . Transportation needs:    Medical: Not on file    Non-medical: Not on file  Tobacco Use  . Smoking status: Never Smoker  . Smokeless tobacco: Never Used  Substance and Sexual Activity  .  Alcohol use: No  . Drug use: No  . Sexual activity: Yes    Birth control/protection: None  Lifestyle  . Physical activity:    Days per week: Not on file    Minutes per session: Not on file  . Stress: Not on file  Relationships  . Social connections:    Talks on phone: Not on file    Gets together: Not on file    Attends religious service: Not on file    Active member of club or organization: Not on file    Attends meetings of clubs or organizations: Not on file    Relationship status: Not on file  . Intimate  partner violence:    Fear of current or ex partner: Not on file    Emotionally abused: Not on file    Physically abused: Not on file    Forced sexual activity: Not on file  Other Topics Concern  . Not on file  Social History Narrative  . Not on file    ROS  Objective   Vitals as reported by the patient: There were no vitals filed for this visit.  Holter monitor study date 01/25/2019 Minimum HR: 47 BPM at 7:43:56 AM Maximum HR: 148 BPM at 5:40:12 AM(2) Average HR: 77 BPM Zero atrial fibrillation 0.1% PVCs, <0.01% PACs Palpitations associated with sinus rhythm, rate 85  Will Camnitz, MD   Diagnoses and all orders for this visit:  Palpitations- reviewed holter monitor Normal result Continue hydration Symptoms relief with meclizine  Dizziness- continue meclizine Follow up with Neurology  Anxiety state- discussed antianxiety behavioral changes     I discussed the assessment and treatment plan with the patient. The patient was provided an opportunity to ask questions and all were answered. The patient agreed with the plan and demonstrated an understanding of the instructions.   The patient was advised to call back or seek an in-person evaluation if the symptoms worsen or if the condition fails to improve as anticipated.  I provided 15 minutes of non-face-to-face time during this encounter.  Doristine BosworthZoe A Estelle Greenleaf, MD  [facility]

## 2019-02-19 ENCOUNTER — Ambulatory Visit: Payer: 59 | Admitting: Family Medicine

## 2019-03-02 ENCOUNTER — Encounter: Payer: Self-pay | Admitting: Family Medicine

## 2019-03-04 ENCOUNTER — Other Ambulatory Visit: Payer: Self-pay

## 2019-03-04 ENCOUNTER — Telehealth: Payer: 59 | Admitting: Registered Nurse

## 2019-03-09 ENCOUNTER — Other Ambulatory Visit: Payer: Self-pay

## 2019-03-09 ENCOUNTER — Telehealth (INDEPENDENT_AMBULATORY_CARE_PROVIDER_SITE_OTHER): Payer: 59 | Admitting: Registered Nurse

## 2019-03-09 DIAGNOSIS — L299 Pruritus, unspecified: Secondary | ICD-10-CM | POA: Diagnosis not present

## 2019-03-09 NOTE — Progress Notes (Signed)
CC- itchy skin/sun burn sensation -in shoulder area and both arm. Started a skin cream and lavender bath with essential oil. Stopped the cream and lavender bath since this to see if it was this causing the symptoms. The sensation and itchiness has gone away since then.

## 2019-03-09 NOTE — Patient Instructions (Signed)
° ° ° °  If you have lab work done today you will be contacted with your lab results within the next 2 weeks.  If you have not heard from us then please contact us. The fastest way to get your results is to register for My Chart. ° ° °IF you received an x-ray today, you will receive an invoice from [facility]. Please contact [facility] at [phone] with questions or concerns regarding your invoice.  ° °IF you received labwork today, you will receive an invoice from [facility]. Please contact [facility] at [phone] with questions or concerns regarding your invoice.  ° °Our billing staff will not be able to assist you with questions regarding bills from these companies. ° °You will be contacted with the lab results as soon as they are available. The fastest way to get your results is to activate your My Chart account. Instructions are located on the last page of this paperwork. If you have not heard from us regarding the results in 2 weeks, please contact this office. °  ° ° ° °

## 2019-03-09 NOTE — Progress Notes (Signed)
Telemedicine Encounter- SOAP NOTE Established Patient  This telephone encounter was conducted with the patient's (or proxy's) verbal consent via audio telecommunications: yes  Patient was instructed to have this encounter in a suitably private space; and to only have persons present to whom they give permission to participate. In addition, patient identity was confirmed by use of name plus two identifiers (DOB and address).  I discussed the limitations, risks, security and privacy concerns of performing an evaluation and management service by telephone and the availability of in person appointments. I also discussed with the patient that there may be a patient responsible charge related to this service. The patient expressed understanding and agreed to proceed.  I spent a total of 13 minutes talking with the patient or their proxy.  CC: Pruritus  Subjective   Heather [name] is a pleasant 38 y.o. established patient. Telephone visit today for pruritus in BUE  Onset: episodic throughout the week before this visit Location: bilateral upper extremities, medial side of arm and towards shoulders Duration: episodic in nature, brief Characteristics: intense itching, burning sensation, warming sensation Agg: none identified Allev: none identified Tx: None tried  Pt reports that she had started a new topical cream and supplement last week and is unsure if they are related to her pruritus. She did not identify a rash with the itching.   She denies SOB, chest pain, congestion, ulcerations, insect bites, fever, hx of eczema, hx of rheumatic process  Recent labs: Recent Results (from the past 2160 hour(s)) -CMP14+EGFR     Status: Abnormal Collection Time: 01/07/19 10:03 AM      Result                                            Value                         Ref Range                      Glucose                                           115 (H)                       65 - 99 mg/dL                   BUN                                               12                            6 - 20 mg/dL                   Creatinine, Ser                                   1.27 (H)  0.57 - 1.00 mg/dL              GFR calc non Af Amer                              54 (L)                        >59 mL/min/1.73                GFR calc Af Amer                                  63                            >59 mL/min/1.73                BUN/Creatinine Ratio                              9                             9 - 23                         Sodium                                            140                           134 - 144 mmol/L               Potassium                                         3.5                           3.5 - 5.2 mmol/L               Chloride                                          94 (L)                        96 - 106 mmol/L                CO2                                               27                            20 -  29 mmol/L                 Calcium                                           9.5                           8.7 - 10.2 mg/dL               Total Protein                                     6.9                           6.0 - 8.5 g/dL                 Albumin                                           4.7                           3.8 - 4.8 g/dL              Comment:                 **Please note reference interval change**      Globulin, Total                                   2.2                           1.5 - 4.5 g/dL                 Albumin/Globulin Ratio                            2.1                           1.2 - 2.2                      Bilirubin Total                                   0.6                           0.0 - 1.2 mg/dL                Alkaline Phosphatase                              68  39 - 117 IU/L                  AST                                               82 (H)                         0 - 40 IU/L                    ALT                                               57 (H)                        0 - 32 IU/L               -Vitamin B12     Status: None Collection Time: 01/07/19 10:03 AM      Result                                            Value                         Ref Range                      Vitamin B-12                                      374                           232 - 1,245 pg/mL         -POCT CBC     Status: Abnormal Collection Time: 01/07/19 10:05 AM      Result                                            Value                         Ref Range                      WBC                                               4.0 (A)                       4.6 - 10.2 K/uL                Lymph, poc  1.6                           0.6 - 3.4                      POC LYMPH PERCENT                                 40.6                          10 - 50 %L                     MID (cbc)                                         0.3                           0 - 0.9                        POC MID %                                         7.0                           0 - 12 %M                      POC Granulocyte                                   2.1                           2 - 6.9                        Granulocyte percent                               52.4                          37 - 80 %G                     RBC                                               3.67 (A)                      4.04 - 5.48 M/uL               Hemoglobin  10.9 (A)                      11 - 14.6 g/dL                 HCT, POC                                          32.3                          29 - 41 %                      MCV                                               88.1                          76 - 111 fL                    MCH, POC                                          29.8                          27 - 31.2 pg                    MCHC                                              33.9                          31.8 - 35.4 g/dL               RDW, POC                                          12.7                          %                              Platelet Count, POC                               220                           142 - 424 K/uL                 MPV  8.5                           0 - 99.8 fL               -POCT glucose (manual entry)     Status: None Collection Time: 01/07/19 10:06 AM      Result                                            Value                         Ref Range                      POC Glucose                                       91                            70 - 99 mg/dl             -POCT CBC     Status: Abnormal Collection Time: 02/01/19  2:28 PM      Result                                            Value                         Ref Range                      WBC                                               5.2                           4.6 - 10.2 K/uL                Lymph, poc                                        1.9 (A)                       0.6 - 3.4                      POC LYMPH PERCENT                                 35.9                          10 - 50 %L  MID (cbc)                                         0.3 (A)                       0 - 0.9                        POC MID %                                         6.0                           0 - 12 %M                      POC Granulocyte                                   3.0                           2 - 6.9                        Granulocyte percent                               58.1                          37 - 80 %G                     RBC                                               4.00 (A)                      4.04 - 5.48 M/uL               Hemoglobin                                        11.8                          11 - 14.6 g/dL                  HCT, POC                                          34.7  29 - 41 %                      MCV                                               86.7                          76 - 111 fL                    MCH, POC                                          29.6                          27 - 31.2 pg                   MCHC                                              34.1                          31.8 - 35.4 g/dL               RDW, POC                                          13.0                          %                              Platelet Count, POC                               262                           142 - 424 K/uL                 MPV                                               8.7                           0 - 99.8 fL               -CMP14+EGFR     Status: None Collection Time: 02/01/19  4:10 PM      Result  Value                         Ref Range                      Glucose                                           75                            65 - 99 mg/dL                  BUN                                               8                             6 - 20 mg/dL                   Creatinine, Ser                                   0.77                          0.57 - 1.00 mg/dL              GFR calc non Af Amer                              100                           >59 mL/min/1.73                GFR calc Af Amer                                  115                           >59 mL/min/1.73                BUN/Creatinine Ratio                              10                            9 - 23                         Sodium  141                           134 - 144 mmol/L               Potassium                                         4.2                           3.5 - 5.2 mmol/L               Chloride                                          104                           96 - 106  mmol/L                CO2                                               21                            20 - 29 mmol/L                 Calcium                                           9.6                           8.7 - 10.2 mg/dL               Total Protein                                     6.8                           6.0 - 8.5 g/dL                 Albumin                                           4.2                           3.8 - 4.8 g/dL                 Globulin, Total  2.6                           1.5 - 4.5 g/dL                 Albumin/Globulin Ratio                            1.6                           1.2 - 2.2                      Bilirubin Total                                   0.5                           0.0 - 1.2 mg/dL                Alkaline Phosphatase                              58                            39 - 117 IU/L                  AST                                               16                            0 - 40 IU/L                    ALT                                               9                             0 - 32 IU/L               -Iron, TIBC and Ferritin Panel     Status: None Collection Time: 02/01/19  4:10 PM      Result                                            Value                         Ref Range                      Total Iron Binding Capacity  282                           250 - 450 ug/dL                UIBC                                              176                           131 - 425 ug/dL                Iron                                              106                           27 - 159 ug/dL                 Iron Saturation                                   38                            15 - 55 %                      Ferritin                                          82                            15 - 150 ng/mL            -VITAMIN D 25 Hydroxy (Vit-D Deficiency, Fractures)      Status: None Collection Time: 02/01/19  4:10 PM      Result                                            Value                         Ref Range                      Vit D, 25-Hydroxy                                 39.6                          30.0 - 100.0 ng/mL          Comment:   Vitamin  D deficiency has been defined by the [place] practice guideline as a   level of serum 25-OH vitamin D less than 20 ng/mL (1,2).   The Endocrine Society went on to further define vitamin D   insufficiency as a level between 21 and 29 ng/mL (2).   1. IOM (Institute of Medicine). 2010. Dietary reference      intakes for calcium and D. [place]: The      [employer].   2. Holick MF, Binkley Bethany, Bischoff-Ferrari HA, et al.      Evaluation, treatment, and prevention of vitamin D      deficiency: an Endocrine Society clinical practice      guideline. JCEM. 2011 Jul; 96(7):1911-30.       Patient Active Problem List   Diagnosis Date Noted  . Rheumatoid factor positive 10/09/2018  . Chronic pain of both knees 10/09/2018  . Clavicle pain 10/09/2018  . Chronic left shoulder pain 10/09/2018  . Chronic left-sided thoracic back pain 10/09/2018  . Class 3 severe obesity due to excess calories with serious comorbidity and body mass index (BMI) of 40.0 to 44.9 in adult ([place]) 10/09/2018  . Arthralgia of multiple joints 10/09/2018  . Hypovitaminosis D 08/11/2017  . Right carpal tunnel syndrome 03/06/2017  . Left carpal tunnel syndrome 03/06/2017    Past Medical History:  Diagnosis Date  . Anemia   . Carpal tunnel syndrome, left 06/2012  . Ganglion cyst 06/2012   left volar  . Herpes genitalia   . Postpartum care following vaginal delivery (11/6) 09/15/2016    Current Outpatient Medications  Medication Sig Dispense Refill  . cetirizine (ZYRTEC) 10 MG tablet Take 10 mg by mouth daily.    . meclizine (ANTIVERT) 25 MG tablet Take 1 tablet (25 mg  total) by mouth 3 (three) times daily as needed for dizziness. 30 tablet 3   No current facility-administered medications for this visit.     No Known Allergies  Social History   Socioeconomic History  . Marital status: Married    Spouse name: Not on file  . Number of children: Not on file  . Years of education: Not on file  . Highest education level: Not on file  Occupational History  . Not on file  Social Needs  . Financial resource strain: Not on file  . Food insecurity:    Worry: Not on file    Inability: Not on file  . Transportation needs:    Medical: Not on file    Non-medical: Not on file  Tobacco Use  . Smoking status: Never Smoker  . Smokeless tobacco: Never Used  Substance and Sexual Activity  . Alcohol use: No  . Drug use: No  . Sexual activity: Yes    Birth control/protection: None  Lifestyle  . Physical activity:    Days per week: Not on file    Minutes per session: Not on file  . Stress: Not on file  Relationships  . Social connections:    Talks on phone: Not on file    Gets together: Not on file    Attends religious service: Not on file    Active member of club or organization: Not on file    Attends meetings of clubs or organizations: Not on file    Relationship status: Not on file  . Intimate partner violence:    Fear of current or ex partner: Not on file    Emotionally  abused: Not on file    Physically abused: Not on file    Forced sexual activity: Not on file  Other Topics Concern  . Not on file  Social History Narrative  . Not on file    ROS  Objective   Vitals as reported by the patient: There were no vitals filed for this visit.  Diagnoses and all orders for this visit:  Pruritus  PLAN:  Likely reaction to new topical cream or supplement. Able to r/o hepatic, renal, or malignant cause with recent lab work. Lack of urticaria not suggestive of allergic rxn.   Pt hx and labs not consistent with endocrine or rheumatic cause   These episodes have ceased since pt stopped the topical cream and supplement. Encouraged her to continue to avoid these.   Encouraged her to write detailed report if another episode should occur: diet, activities, medications and supplements taken that day etc in order to try to narrow down a trigger  Pt advised on reason to call clinic: hives, SOB, new rash, intractable itching, etc  Pt encouraged to use moisturizer and ice to help with symptoms should they reoccur.  Pt encouraged to contact clinic with any questions, comments, or complaints.    I discussed the assessment and treatment plan with the patient. The patient was provided an opportunity to ask questions and all were answered. The patient agreed with the plan and demonstrated an understanding of the instructions.   The patient was advised to call back or seek an in-person evaluation if the symptoms worsen or if the condition fails to improve as anticipated.  I provided 13 minutes of non-face-to-face time during this encounter.  Maximiano Coss, NP  Primary [facility] at [facility]

## 2019-04-15 NOTE — Progress Notes (Signed)
Virtual Visit via Video Note The purpose of this virtual visit is to provide medical care while limiting exposure to the novel coronavirus.    Consent was obtained for video visit:  Yes  Answered questions that patient had about telehealth interaction:  Yes I discussed the limitations, risks, security and privacy concerns of performing an evaluation and management service by telemedicine. I also discussed with the patient that there may be a patient responsible charge related to this service. The patient expressed understanding and agreed to proceed.  Pt location: Home Physician Location: Home Name of referring provider:  Doristine Bosworth, MD I connected with Yumiko [name] at patients initiation/request on 04/18/2019 at  1:50 PM EDT by video enabled telemedicine application and verified that I am speaking with the correct person using two identifiers. Pt MRN:  [id number] Pt DOB:  February 21, 1981 Video Participants:  Ave [name]   History of Present Illness:  Heather [name] is a 38 year old woman with history of carpal tunnel syndrome who presents for dizziness.  History supplemented by referring provider notes.  She has been experiencing dizziness since March.  She has had occasional dizziness in the past but it has become more frequent.  Dizziness is described as a lightheaded sensation, however if she lays her head down on the side, she does have spinning.  She reports associated bifrontal head pressure and associated aural fullness. It often occurs if she starts thinking about it.  Nothing has been too effective to treat it.  It can last from a couple of minutes to all day.  It occurs daily. She was seen by ENT and treated for eustachian tube dysfunction with nasocort and zyrtec.  She has been experiencing cough and postnasal drip.  Seasonal allergies seem to make the dizziness worse.  She also has been experiencing palpitations.  Holter monitor was negative.  She does report tinnitus.   She denies associated hearing loss, double vision or unilateral numbness or weakness.  She also has history of anxiety and chronic pain.    Labs from February and March, inclucing CBC, CMP, iron, B12, and vitamin D, were unremarkable.  Past Medical History: Past Medical History:  Diagnosis Date   Anemia    Carpal tunnel syndrome, left 06/2012   Ganglion cyst 06/2012   left volar   Herpes genitalia    Postpartum care following vaginal delivery (11/6) 09/15/2016    Medications: Outpatient Encounter Medications as of 04/18/2019  Medication Sig   cetirizine (ZYRTEC) 10 MG tablet Take 10 mg by mouth daily.   meclizine (ANTIVERT) 25 MG tablet Take 1 tablet (25 mg total) by mouth 3 (three) times daily as needed for dizziness.   No facility-administered encounter medications on file as of 04/18/2019.     Allergies: No Known Allergies  Family History: Family History  Problem Relation Age of Onset   Hypertension Mother    Hypertension Father    Hyperlipidemia Father    Breast cancer Maternal Aunt        pt unsure   Breast cancer Maternal Uncle    Breast cancer Maternal Aunt        pt unsure    Social History: Social History   Socioeconomic History   Marital status: Married    Spouse name: Not on file   Number of children: Not on file   Years of education: Not on file   Highest education level: Not on file  Occupational History   Not on file  Social Needs  Financial resource strain: Not on file   Food insecurity:    Worry: Not on file    Inability: Not on file   Transportation needs:    Medical: Not on file    Non-medical: Not on file  Tobacco Use   Smoking status: Never Smoker   Smokeless tobacco: Never Used  Substance and Sexual Activity   Alcohol use: No   Drug use: No   Sexual activity: Yes    Birth control/protection: None  Lifestyle   Physical activity:    Days per week: Not on file    Minutes per session: Not on file   Stress:  Not on file  Relationships   Social connections:    Talks on phone: Not on file    Gets together: Not on file    Attends religious service: Not on file    Active member of club or organization: Not on file    Attends meetings of clubs or organizations: Not on file    Relationship status: Not on file   Intimate partner violence:    Fear of current or ex partner: Not on file    Emotionally abused: Not on file    Physically abused: Not on file    Forced sexual activity: Not on file  Other Topics Concern   Not on file  Social History Narrative   Not on file   Observations/Objective:   Vitals:  no acute distress.  Alert and oriented.  Speech fluent and not dysarthric.  Language intact.  Eyes orthophoric on primary gaze.  Face symmetric.    Assessment and Plan:   1.  Dizziness.  Likely related to seasonal allergies/eustachian tube dysfunction.  Maybe some component of BPPV (when she lays on her side).  There are no red flags to suggest intracranial abnormality.  No further neurologic workup warranted. She may follow up as needed.  Follow Up Instructions:    -I discussed the assessment and treatment plan with the patient. The patient was provided an opportunity to ask questions and all were answered. The patient agreed with the plan and demonstrated an understanding of the instructions.   The patient was advised to call back or seek an in-person evaluation if the symptoms worsen or if the condition fails to improve as anticipated.      Cira ServantAdam Robert Treston Coker, DO

## 2019-04-18 ENCOUNTER — Encounter: Payer: Self-pay | Admitting: Neurology

## 2019-04-18 ENCOUNTER — Other Ambulatory Visit: Payer: Self-pay

## 2019-04-18 ENCOUNTER — Telehealth (INDEPENDENT_AMBULATORY_CARE_PROVIDER_SITE_OTHER): Payer: 59 | Admitting: Neurology

## 2019-04-18 DIAGNOSIS — H6980 Other specified disorders of Eustachian tube, unspecified ear: Secondary | ICD-10-CM

## 2019-04-18 DIAGNOSIS — J302 Other seasonal allergic rhinitis: Secondary | ICD-10-CM

## 2019-04-18 DIAGNOSIS — R42 Dizziness and giddiness: Secondary | ICD-10-CM

## 2019-04-19 ENCOUNTER — Ambulatory Visit: Payer: 59 | Admitting: Neurology

## 2019-04-22 ENCOUNTER — Other Ambulatory Visit: Payer: Self-pay

## 2019-04-22 ENCOUNTER — Ambulatory Visit (INDEPENDENT_AMBULATORY_CARE_PROVIDER_SITE_OTHER): Payer: 59 | Admitting: Family Medicine

## 2019-04-22 ENCOUNTER — Encounter: Payer: Self-pay | Admitting: Family Medicine

## 2019-04-22 VITALS — BP 131/89 | HR 97 | Temp 99.2°F | Resp 18 | Ht 68.0 in | Wt 265.2 lb

## 2019-04-22 DIAGNOSIS — R208 Other disturbances of skin sensation: Secondary | ICD-10-CM | POA: Diagnosis not present

## 2019-04-22 DIAGNOSIS — R002 Palpitations: Secondary | ICD-10-CM | POA: Diagnosis not present

## 2019-04-22 DIAGNOSIS — R42 Dizziness and giddiness: Secondary | ICD-10-CM

## 2019-04-22 DIAGNOSIS — Z8669 Personal history of other diseases of the nervous system and sense organs: Secondary | ICD-10-CM

## 2019-04-22 DIAGNOSIS — F439 Reaction to severe stress, unspecified: Secondary | ICD-10-CM | POA: Diagnosis not present

## 2019-04-22 NOTE — Patient Instructions (Addendum)
I do not appreciate any concerns on exam today.  Keep follow-up with ear nose and throat specialist.  They may recommend that you restart some of the allergy medicines or treatments for possible allergies causing eustachian tube dysfunction. Stress/anxiety may be contributing to symptoms as we discussed.  It may be helpful to meet with a therapist to discuss the symptoms and other stressors.  I did provide a number below, or can follow-up with your previous provider.  Also see information below on management of stress.  Although I do not appreciate any nerve issues on your exam today, you can follow back up with Dr. Erlinda Hong to see if repeat nerve studies are needed with prior carpal tunnel symptoms. Follow-up with Dr. Nolon Rod in the next 3 to 4 weeks Return to the clinic or go to the nearest emergency room if any of your symptoms worsen or new symptoms occur.    Stress Stress is a normal reaction to life events. Stress is what you feel when life demands more than you are used to, or more than you think you can handle. Some stress can be useful, such as studying for a test or meeting a deadline at work. Stress that occurs too often or for too long can cause problems. It can affect your emotional health and interfere with relationships and normal daily activities. Too much stress can weaken your body's defense system (immune system) and increase your risk for physical illness. If you already have a medical problem, stress can make it worse. What are the causes? All sorts of life events can cause stress. An event that causes stress for one person may not be stressful for another person. Major life events, whether positive or negative, commonly cause stress. Examples include:  Losing a job or starting a new job.  Losing a loved one.  Moving to a new town or home.  Getting married or divorced.  Having a baby.  Injury or illness. Less obvious life events can also cause stress, especially if they  occur day after day or in combination with each other. Examples include:  Working long hours.  Driving in traffic.  Caring for children.  Being in debt.  Being in a difficult relationship. What are the signs or symptoms? Stress can cause emotional symptoms, including:  Anxiety. This is feeling worried, afraid, on edge, overwhelmed, or out of control.  Anger, including irritation or impatience.  Depression. This is feeling sad, down, helpless, or guilty.  Trouble focusing, remembering, or making decisions. Stress can cause physical symptoms, including:  Aches and pains. These may affect your head, neck, back, stomach, or other areas of your body.  Tight muscles or a clenched jaw.  Low energy.  Trouble sleeping. Stress can cause unhealthy behaviors, including:  Eating to feel better (overeating) or skipping meals.  Working too much or putting off tasks.  Smoking, drinking alcohol, or using drugs to feel better. How is this diagnosed? Stress is diagnosed through an assessment by your health care provider. He or she may diagnose this condition based on:  Your symptoms and any stressful life events.  Your medical history.  Tests to rule out other causes of your symptoms. Depending on your condition, your health care provider may refer you to a specialist for further evaluation. How is this treated?  Stress management techniques are the recommended treatment for stress. Medicine is not typically recommended for the treatment of stress. Techniques to reduce your reaction to stressful life events include:  Stress identification.  Monitor yourself for symptoms of stress and identify what causes stress for you. These skills may help you to avoid or prepare for stressful events.  Time management. Set your priorities, keep a calendar of events, and learn to say "no." Taking these actions can help you avoid making too many commitments. Techniques for coping with stress  include:  Rethinking the problem. Try to think realistically about stressful events rather than ignoring them or overreacting. Try to find the positives in a stressful situation rather than focusing on the negatives.  Exercise. Physical exercise can release both physical and emotional tension. The key is to find a form of exercise that you enjoy and do it regularly.  Relaxation techniques. These relax the body and mind. The key is to find one or more that you enjoy and use the technique(s) regularly. Examples include: ? Meditation, deep breathing, or progressive relaxation techniques. ? Yoga or tai chi. ? Biofeedback, mindfulness techniques, or journaling. ? Listening to music, being out in nature, or participating in other hobbies.  Practicing a healthy lifestyle. Eat a balanced diet, drink plenty of water, limit or avoid caffeine, and get plenty of sleep.  Having a strong support network. Spend time with family, friends, or other people you enjoy being around. Express your feelings and talk things over with someone you trust. Counseling or talk therapy with a mental health professional may be helpful if you are having trouble managing stress on your own. Follow these instructions at home: Lifestyle   Avoid drugs.  Do not use any products that contain nicotine or tobacco, such as cigarettes and e-cigarettes. If you need help quitting, ask your health care provider.  Limit alcohol intake to no more than 1 drink a day for nonpregnant women and 2 drinks a day for men. One drink equals 12 oz of beer, 5 oz of wine, or 1 oz of hard liquor.  Do not use alcohol or drugs to relax.  Eat a balanced diet that includes fresh fruits and vegetables, whole grains, lean meats, fish, eggs, and beans, and low-fat dairy. Avoid processed foods and foods high in added fat, sugar, and salt.  Exercise at least 30 minutes on 5 or more days each week.  Get 7-8 hours of sleep each night. General  instructions   Practice stress management techniques as discussed with your health care provider.  Drink enough fluid to keep your urine clear or pale yellow.  Take over-the-counter and prescription medicines only as told by your health care provider.  Keep all follow-up visits as told by your health care provider. This is important. Contact a health care provider if:  Your symptoms get worse.  You have new symptoms.  You feel overwhelmed by your problems and can no longer manage them on your own. Get help right away if:  You have thoughts of hurting yourself or others. If you ever feel like you may hurt yourself or others, or have thoughts about taking your own life, get help right away. You can go to your nearest emergency department or call:  Your local emergency services (911 in the [place]  A suicide crisis helpline, such as the [facility] at 6508662052. This is open 24 hours a day. Summary  Stress is a normal reaction to life events. It can cause problems if it happens too often or for too long.  Practicing stress management techniques is the best way to treat stress.  Counseling or talk therapy with a mental health professional  may be helpful if you are having trouble managing stress on your own. This information is not intended to replace advice given to you by your health care provider. Make sure you discuss any questions you have with your health care provider. Document Released: 04/22/2001 Document Revised: 12/17/2016 Document Reviewed: 12/17/2016 Elsevier Interactive Patient Education  2019 [facility]: [phone]   If you have lab work done today you will be contacted with your lab results within the next 2 weeks.  If you have not heard from [place] then please contact us. The fastest way to get your results is to register for My Chart.   IF you received an x-ray today, you will receive an invoice  from [facility]. Please contact [facility] at [phone] with questions or concerns regarding your invoice.   IF you received labwork today, you will receive an invoice from [facility]. Please contact [facility] at [phone] with questions or concerns regarding your invoice.   Our billing staff will not be able to assist you with questions regarding bills from these companies.  You will be contacted with the lab results as soon as they are available. The fastest way to get your results is to activate your My Chart account. Instructions are located on the last page of this paperwork. If you have not heard from [place] regarding the results in 2 weeks, please contact this office.

## 2019-04-22 NOTE — Progress Notes (Signed)
Subjective:    Patient ID: Heather [name], female    DOB: [birth year], 38 y.o.   MRN: [id number]  HPI  Heather [name] is a 38 y.o. female Presents today for: Chief Complaint  Patient presents with  . Dizziness    still having dizzy spells  . Arm Pain    still having left arm pain; went to nerologist and was told nothing needs to be done  new patient to me, Forrest Moron, MD  10 min chart review.   Dizziness: Previously treated by Dr. Nolon Rod on multiple prior visits.  Dizziness associated with palpitations. Holter monitor reportedly negative -0 A. fib.  Low PVCs/PACs.  unremarkable prior vitamin D, B12, iron, CMP, CBC. Last visit with Dr. Nolon Rod in April, recommended continued hydration, symptom relief with meclizine, and antianxiety behavioral changes discussed for possible anxiety state.  Evaluated by ear nose and throat, Dr. Blenda Nicely on May 6 for ear fullness bilaterally with eustachian tube dysfunction bilaterally.  Home treatment options were discussed for ETD, discussed Sudafed the counter, Allegra or Zyrtec daily, vertigo home exercises, and Flonase nasal spray twice per day.  Plan for audiogram with tympanogram prior to her next visit in 4 weeks.  Evaluated by [facility] neurology, Dr. Tomi Likens on June 8.  Noted occasional dizziness in the past but more frequent recently since March.  Did note that seasonal allergies seem to make the dizziness worse.  Dizziness was thought to be related to seasonal allergies or eustachian tube dysfunction with some component of BPPV when she lays on her side.  No red flags to suggest intracranial abnormality, and no further neurologic work-up  warranted based on that visit.   Today: Dizziness has overall gotten better. Still coming and going.  Home exercise for vertigo have helped.  flonase ns - tried, but less congestion so stopped.  No recent zyrtec or meclizine - having an arm sensation- has noted since April. Has burning sensation  in forearm, left greater than right. Sx's come and go.  No persistent weakness.  Did discuss symptoms with neuro, but did not think those were neurologic, but option of follow up.  Keyboarding/computer work -but actually less keyboard.  Some increased anxiety about her symptoms, but some generalized worry.  Tx: long hot baths, sitting outside. Notes if think about symptoms(dizziness/heart palpitation) then those tend to recur.  Anxiety few years ago after dtr born. Out of work at that time, met with therapist then. Did not like med. No recent counseling.   Prior treatment for Carpal tunnel syndrome - Dr. Erlinda Hong, 2018. Dr Fredna Dow prior with release in approx 2013.   Patient Active Problem List   Diagnosis Date Noted  . Rheumatoid factor positive 10/09/2018  . Chronic pain of both knees 10/09/2018  . Clavicle pain 10/09/2018  . Chronic left shoulder pain 10/09/2018  . Chronic left-sided thoracic back pain 10/09/2018  . Class 3 severe obesity due to excess calories with serious comorbidity and body mass index (BMI) of 40.0 to 44.9 in adult ([place]) 10/09/2018  . Arthralgia of multiple joints 10/09/2018  . Hypovitaminosis D 08/11/2017  . Right carpal tunnel syndrome 03/06/2017  . Left carpal tunnel syndrome 03/06/2017   Past Medical History:  Diagnosis Date  . Anemia   . Carpal tunnel syndrome, left 06/2012  . Ganglion cyst 06/2012   left volar  . Herpes genitalia   . Postpartum care following vaginal delivery (11/6) 09/15/2016   Past Surgical History:  Procedure Laterality Date  . CARPAL TUNNEL RELEASE  06/28/2012   Procedure: CARPAL TUNNEL RELEASE;  Surgeon: Tennis Must, MD;  Location: [facility];  Service: Orthopedics;  Laterality: Left;  EXCISION VOLAR GANGLION CYST and carpal tunnel release left timeouts for ganglion cyst done at same time as timeouts for carpal tunnel with same staffs  . WISDOM TOOTH EXTRACTION     Allergies  Allergen Reactions  . Meclizine Itching    Prior to Admission medications   Medication Sig Start Date End Date Taking? Authorizing Provider  Multiple Vitamins-Minerals (THERA-M) TABS Take by mouth as directed.   Yes [provider]   Social History   Socioeconomic History  . Marital status: Married    Spouse name: Not on file  . Number of children: 2  . Years of education: Not on file  . Highest education level: Not on file  Occupational History  . Not on file  Social Needs  . Financial resource strain: Not on file  . Food insecurity    Worry: Not on file    Inability: Not on file  . Transportation needs    Medical: Not on file    Non-medical: Not on file  Tobacco Use  . Smoking status: Never Smoker  . Smokeless tobacco: Never Used  Substance and Sexual Activity  . Alcohol use: No  . Drug use: No  . Sexual activity: Yes    Birth control/protection: None  Lifestyle  . Physical activity    Days per week: Not on file    Minutes per session: Not on file  . Stress: Not on file  Relationships  . Social Herbalist on phone: Not on file    Gets together: Not on file    Attends religious service: Not on file    Active member of club or organization: Not on file    Attends meetings of clubs or organizations: Not on file    Relationship status: Not on file  . Intimate partner violence    Fear of current or ex partner: Not on file    Emotionally abused: Not on file    Physically abused: Not on file    Forced sexual activity: Not on file  Other Topics Concern  . Not on file  Social History Narrative   Right handed   Lives in two story home with husband and children   Works for [employer] Per HPI.     Objective:   Physical Exam Vitals signs reviewed.  Constitutional:      General: She is not in acute distress.    Appearance: She is well-developed.  HENT:     Head: Normocephalic and atraumatic.  Eyes:     Conjunctiva/sclera: Conjunctivae normal.     Pupils:  Pupils are equal, round, and reactive to light.  Neck:     Vascular: No carotid bruit.  Cardiovascular:     Rate and Rhythm: Normal rate and regular rhythm.     Heart sounds: Normal heart sounds.  Pulmonary:     Effort: Pulmonary effort is normal.     Breath sounds: Normal breath sounds.  Abdominal:     Palpations: Abdomen is soft. There is no pulsatile mass.     Tenderness: There is no abdominal tenderness.  Musculoskeletal:     Right elbow: No tenderness (negative tinel over radial and ulnar nn. ) found.     Left elbow: No tenderness (negative tinel over radial and ulnar nn. ) found.  Right wrist: She exhibits no tenderness (negative median nn tinel and no sx's with percussion at [facility] canal. ) and no deformity.     Left wrist: She exhibits no tenderness (negative tinel at median nn and no sx's with percussion at [facility] canal. ) and no deformity.     Cervical back: She exhibits normal range of motion (Equal range of motion in cervical spine and does not reproduce arm symptoms with range of motion.).  Skin:    General: Skin is warm and dry.  Neurological:     Mental Status: She is alert and oriented to person, place, and time.     Deep Tendon Reflexes:     Reflex Scores:      Tricep reflexes are 2+ on the right side and 2+ on the left side.      Bicep reflexes are 2+ on the right side and 2+ on the left side.      Brachioradialis reflexes are 2+ on the right side and 2+ on the left side.    Comments: Reflexes upper extremities equal and 2+ bilaterally.  Neurovascular intact distally with cap refill less than 1 second, fingertips warm.  Equal grip strength, finger opposition, and arm strength bilaterally.  Psychiatric:        Behavior: Behavior normal.    Vitals:   04/22/19 1623  BP: 131/89  Pulse: 97  Resp: 18  Temp: 99.2 F (37.3 C)  SpO2: 99%       Assessment & Plan:   Heather [name] is a 38 y.o. female Palpitations  Dizziness  Situational stress   Dysesthesia  History of carpal tunnel syndrome  Recurrent dizziness with episodic palpitations, episodic dysesthesias of left greater than right arm.  Has undergone significant work-up of symptoms as above including evaluation with neurology and otorhinolaryngology.    -Suspect a component of eustachian tube dysfunction or allergic rhinitis.  Would consider restarting Flonase, but has follow-up in a few days with ENT for tympanogram.  -No apparent neurologic cause when seen by neuro.  Does report history of carpal tunnel symptoms but on exam did not exhibit typical carpal tunnel symptomatology.  Could follow-up with Dr. Erlinda Hong who most recently treated her for carpal tunnel syndrome to decide if repeat nerve conduction studies may be indicated  -With onset of symptoms after thinking about the symptomatology, there is a possible anxiety or stress component.  Handout given on stress and stress management, but would consider meeting with therapist to determine if significant anxiety component.  Numbers provided.  -Follow-up with primary care provider next few weeks, sooner if worsening symptoms with RTC/ER precautions given  No orders of the defined types were placed in this encounter.  Patient Instructions   I do not appreciate any concerns on exam today.  Keep follow-up with ear nose and throat specialist.  They may recommend that you restart some of the allergy medicines or treatments for possible allergies causing eustachian tube dysfunction. Stress/anxiety may be contributing to symptoms as we discussed.  It may be helpful to meet with a therapist to discuss the symptoms and other stressors.  I did provide a number below, or can follow-up with your previous provider.  Also see information below on management of stress.  Although I do not appreciate any nerve issues on your exam today, you can follow back up with Dr. Erlinda Hong to see if repeat nerve studies are needed with prior carpal tunnel symptoms.  Follow-up with Dr. Nolon Rod in the next 3 to 4  weeks Return to the clinic or go to the nearest emergency room if any of your symptoms worsen or new symptoms occur.    Stress Stress is a normal reaction to life events. Stress is what you feel when life demands more than you are used to, or more than you think you can handle. Some stress can be useful, such as studying for a test or meeting a deadline at work. Stress that occurs too often or for too long can cause problems. It can affect your emotional health and interfere with relationships and normal daily activities. Too much stress can weaken your body's defense system (immune system) and increase your risk for physical illness. If you already have a medical problem, stress can make it worse. What are the causes? All sorts of life events can cause stress. An event that causes stress for one person may not be stressful for another person. Major life events, whether positive or negative, commonly cause stress. Examples include:  Losing a job or starting a new job.  Losing a loved one.  Moving to a new town or home.  Getting married or divorced.  Having a baby.  Injury or illness. Less obvious life events can also cause stress, especially if they occur day after day or in combination with each other. Examples include:  Working long hours.  Driving in traffic.  Caring for children.  Being in debt.  Being in a difficult relationship. What are the signs or symptoms? Stress can cause emotional symptoms, including:  Anxiety. This is feeling worried, afraid, on edge, overwhelmed, or out of control.  Anger, including irritation or impatience.  Depression. This is feeling sad, down, helpless, or guilty.  Trouble focusing, remembering, or making decisions. Stress can cause physical symptoms, including:  Aches and pains. These may affect your head, neck, back, stomach, or other areas of your body.  Tight muscles or a clenched  jaw.  Low energy.  Trouble sleeping. Stress can cause unhealthy behaviors, including:  Eating to feel better (overeating) or skipping meals.  Working too much or putting off tasks.  Smoking, drinking alcohol, or using drugs to feel better. How is this diagnosed? Stress is diagnosed through an assessment by your health care provider. He or she may diagnose this condition based on:  Your symptoms and any stressful life events.  Your medical history.  Tests to rule out other causes of your symptoms. Depending on your condition, your health care provider may refer you to a specialist for further evaluation. How is this treated?  Stress management techniques are the recommended treatment for stress. Medicine is not typically recommended for the treatment of stress. Techniques to reduce your reaction to stressful life events include:  Stress identification. Monitor yourself for symptoms of stress and identify what causes stress for you. These skills may help you to avoid or prepare for stressful events.  Time management. Set your priorities, keep a calendar of events, and learn to say "no." Taking these actions can help you avoid making too many commitments. Techniques for coping with stress include:  Rethinking the problem. Try to think realistically about stressful events rather than ignoring them or overreacting. Try to find the positives in a stressful situation rather than focusing on the negatives.  Exercise. Physical exercise can release both physical and emotional tension. The key is to find a form of exercise that you enjoy and do it regularly.  Relaxation techniques. These relax the body and mind. The key is to find one  or more that you enjoy and use the technique(s) regularly. Examples include: ? Meditation, deep breathing, or progressive relaxation techniques. ? Yoga or tai chi. ? Biofeedback, mindfulness techniques, or journaling. ? Listening to music, being out in  nature, or participating in other hobbies.  Practicing a healthy lifestyle. Eat a balanced diet, drink plenty of water, limit or avoid caffeine, and get plenty of sleep.  Having a strong support network. Spend time with family, friends, or other people you enjoy being around. Express your feelings and talk things over with someone you trust. Counseling or talk therapy with a mental health professional may be helpful if you are having trouble managing stress on your own. Follow these instructions at home: Lifestyle   Avoid drugs.  Do not use any products that contain nicotine or tobacco, such as cigarettes and e-cigarettes. If you need help quitting, ask your health care provider.  Limit alcohol intake to no more than 1 drink a day for nonpregnant women and 2 drinks a day for men. One drink equals 12 oz of beer, 5 oz of wine, or 1 oz of hard liquor.  Do not use alcohol or drugs to relax.  Eat a balanced diet that includes fresh fruits and vegetables, whole grains, lean meats, fish, eggs, and beans, and low-fat dairy. Avoid processed foods and foods high in added fat, sugar, and salt.  Exercise at least 30 minutes on 5 or more days each week.  Get 7-8 hours of sleep each night. General instructions   Practice stress management techniques as discussed with your health care provider.  Drink enough fluid to keep your urine clear or pale yellow.  Take over-the-counter and prescription medicines only as told by your health care provider.  Keep all follow-up visits as told by your health care provider. This is important. Contact a health care provider if:  Your symptoms get worse.  You have new symptoms.  You feel overwhelmed by your problems and can no longer manage them on your own. Get help right away if:  You have thoughts of hurting yourself or others. If you ever feel like you may hurt yourself or others, or have thoughts about taking your own life, get help right away. You  can go to your nearest emergency department or call:  Your local emergency services (911 in the [place]  A suicide crisis helpline, such as the [facility] at (873) 676-0323. This is open 24 hours a day. Summary  Stress is a normal reaction to life events. It can cause problems if it happens too often or for too long.  Practicing stress management techniques is the best way to treat stress.  Counseling or talk therapy with a mental health professional may be helpful if you are having trouble managing stress on your own. This information is not intended to replace advice given to you by your health care provider. Make sure you discuss any questions you have with your health care provider. Document Released: 04/22/2001 Document Revised: 12/17/2016 Document Reviewed: 12/17/2016 Elsevier Interactive Patient Education  2019 [facility]: [phone]   If you have lab work done today you will be contacted with your lab results within the next 2 weeks.  If you have not heard from [place] then please contact us. The fastest way to get your results is to register for My Chart.   IF you received an x-ray today, you will receive an invoice from [facility]. Please contact [facility]  at [phone] with questions or concerns regarding your invoice.   IF you received labwork today, you will receive an invoice from [facility]. Please contact [facility] at [phone] with questions or concerns regarding your invoice.   Our billing staff will not be able to assist you with questions regarding bills from these companies.  You will be contacted with the lab results as soon as they are available. The fastest way to get your results is to activate your My Chart account. Instructions are located on the last page of this paperwork. If you have not heard from [place] regarding the results in 2 weeks, please contact this office.        Signed,   Merri Ray, MD [facility].  04/22/19 6:11 PM

## 2019-04-25 DIAGNOSIS — H6983 Other specified disorders of Eustachian tube, bilateral: Secondary | ICD-10-CM | POA: Insufficient documentation

## 2019-05-18 ENCOUNTER — Ambulatory Visit (INDEPENDENT_AMBULATORY_CARE_PROVIDER_SITE_OTHER): Payer: 59 | Admitting: Family Medicine

## 2019-05-18 ENCOUNTER — Other Ambulatory Visit: Payer: Self-pay

## 2019-05-18 ENCOUNTER — Encounter: Payer: Self-pay | Admitting: Family Medicine

## 2019-05-18 VITALS — BP 125/80 | HR 94 | Temp 99.1°F | Resp 17 | Ht 68.0 in | Wt 265.8 lb

## 2019-05-18 DIAGNOSIS — H814 Vertigo of central origin: Secondary | ICD-10-CM | POA: Diagnosis not present

## 2019-05-18 DIAGNOSIS — R42 Dizziness and giddiness: Secondary | ICD-10-CM | POA: Diagnosis not present

## 2019-05-18 NOTE — Progress Notes (Signed)
Established Patient Office Visit  Subjective:  Patient ID: Heather [name], female    DOB: [birth year]  Age: 37 y.o. MRN: [id number]  CC:  Chief Complaint  Patient presents with  . Dizziness    4 week f/u, dizziness is hasn't gone away  . numbness and tingling in left arm that is intermittent with     HPI Heather [name] presents for   Dizziness and Palpitations Pt reports that she was seen by Audiology She feels okay when she wakes up in the morning When she goes to work she feels dizziness When she is laying down or looking down at her phone she feels the dizziness She can feel dizzy if she drinks through a straw  Audiology evaluation states that she has a eustachian tube dysfunction She states that she is following the instructions but not getting any relief She felt very dizzy and "not herself" She does not drink caffeine and drinks plenty of water ENT recommended Allegra and Nasocort   BP Readings from Last 3 Encounters:  05/18/19 125/80  04/22/19 131/89  02/01/19 (!) 141/88    Morbid Obesity She has cut out meat and is doing YOGA She is eating more vegetables. She is doing fruit smoothies that she makes from frozen or fresh. She adds more onions and peppers and notices that she gets more drainage and palpitations.  Body mass index is 40.41 kg/m.  Wt Readings from Last 3 Encounters:  05/18/19 265 lb 12.8 oz (120.6 kg)  04/22/19 265 lb 3.2 oz (120.3 kg)  02/01/19 263 lb (119.3 kg)     Past Medical History:  Diagnosis Date  . Anemia   . Carpal tunnel syndrome, left 06/2012  . Ganglion cyst 06/2012   left volar  . Herpes genitalia   . Postpartum care following vaginal delivery (11/6) 09/15/2016    Past Surgical History:  Procedure Laterality Date  . CARPAL TUNNEL RELEASE  06/28/2012   Procedure: CARPAL TUNNEL RELEASE;  Surgeon: Tennis Must, MD;  Location: [facility];  Service: Orthopedics;  Laterality: Left;  EXCISION VOLAR GANGLION  CYST and carpal tunnel release left timeouts for ganglion cyst done at same time as timeouts for carpal tunnel with same staffs  . WISDOM TOOTH EXTRACTION      Family History  Problem Relation Age of Onset  . Hypertension Mother   . Hypertension Father   . Hyperlipidemia Father   . Breast cancer Maternal Aunt        pt unsure  . Breast cancer Maternal Uncle   . Breast cancer Maternal Aunt        pt unsure    Social History   Socioeconomic History  . Marital status: Married    Spouse name: Not on file  . Number of children: 2  . Years of education: Not on file  . Highest education level: Not on file  Occupational History  . Not on file  Social Needs  . Financial resource strain: Not on file  . Food insecurity    Worry: Not on file    Inability: Not on file  . Transportation needs    Medical: Not on file    Non-medical: Not on file  Tobacco Use  . Smoking status: Never Smoker  . Smokeless tobacco: Never Used  Substance and Sexual Activity  . Alcohol use: No  . Drug use: No  . Sexual activity: Yes    Birth control/protection: None  Lifestyle  . Physical activity  Days per week: Not on file    Minutes per session: Not on file  . Stress: Not on file  Relationships  . Social Musicianconnections    Talks on phone: Not on file    Gets together: Not on file    Attends religious service: Not on file    Active member of club or organization: Not on file    Attends meetings of clubs or organizations: Not on file    Relationship status: Not on file  . Intimate partner violence    Fear of current or ex partner: Not on file    Emotionally abused: Not on file    Physically abused: Not on file    Forced sexual activity: Not on file  Other Topics Concern  . Not on file  Social History Narrative   Right handed   Lives in two story home with husband and children   Works for [employer]    Outpatient Medications Prior to Visit  Medication Sig Dispense Refill  .  Multiple Vitamins-Minerals (THERA-M) TABS Take by mouth as directed.     No facility-administered medications prior to visit.     Allergies  Allergen Reactions  . Meclizine Itching    ROS Review of Systems Review of Systems  Constitutional: Negative for activity change, appetite change, chills and fever.  HENT: Negative for congestion, nosebleeds, trouble swallowing and voice change.   Respiratory: Negative for cough, shortness of breath and wheezing.   Gastrointestinal: Negative for diarrhea, nausea and vomiting.  Genitourinary: Negative for difficulty urinating, dysuria, flank pain and hematuria.  Musculoskeletal: Negative for back pain, joint swelling and neck pain.  Neurological: Negative for dizziness, speech difficulty, light-headedness and numbness.  See HPI. All other review of systems negative.     Objective:    Physical Exam  BP 125/80 (BP Location: Right Arm, Patient Position: Sitting, Cuff Size: Large)   Pulse 94   Temp 99.1 F (37.3 C) (Oral)   Resp 17   Ht 5\' 8"  (1.727 m)   Wt 265 lb 12.8 oz (120.6 kg)   LMP 05/18/2019   SpO2 100%   BMI 40.41 kg/m  Wt Readings from Last 3 Encounters:  05/18/19 265 lb 12.8 oz (120.6 kg)  04/22/19 265 lb 3.2 oz (120.3 kg)  02/01/19 263 lb (119.3 kg)   Physical Exam  Constitutional: Oriented to person, place, and time. Appears well-developed and well-nourished.  HENT:  Head: Normocephalic and atraumatic.  Eyes: Conjunctivae and EOM are normal.  Cardiovascular: Normal rate, regular rhythm, normal heart sounds and intact distal pulses.  No murmur heard. Pulmonary/Chest: Effort normal and breath sounds normal. No stridor. No respiratory distress. Has no wheezes.  Neurological: Is alert and oriented to person, place, and time.  Skin: Skin is warm. Capillary refill takes less than 2 seconds.  Psychiatric: Has a normal mood and affect. Behavior is normal. Judgment and thought content normal.    There are no preventive care  reminders to display for this patient.  There are no preventive care reminders to display for this patient.  Lab Results  Component Value Date   TSH 1.160 10/04/2018   Lab Results  Component Value Date   WBC 4.7 05/18/2019   HGB 11.5 05/18/2019   HCT 35.3 05/18/2019   MCV 88 05/18/2019   PLT 236 05/18/2019   Lab Results  Component Value Date   NA 140 05/18/2019   K 4.4 05/18/2019   CO2 23 05/18/2019   GLUCOSE 91 05/18/2019  BUN 6 05/18/2019   CREATININE 0.83 05/18/2019   BILITOT 0.5 02/01/2019   ALKPHOS 58 02/01/2019   AST 16 02/01/2019   ALT 9 02/01/2019   PROT 6.8 02/01/2019   ALBUMIN 4.2 02/01/2019   CALCIUM 8.9 05/18/2019   Lab Results  Component Value Date   CHOL 155 08/10/2017   Lab Results  Component Value Date   HDL 56 08/10/2017   Lab Results  Component Value Date   LDLCALC 90 08/10/2017   Lab Results  Component Value Date   TRIG 46 08/10/2017   Lab Results  Component Value Date   CHOLHDL 2.8 08/10/2017   Lab Results  Component Value Date   HGBA1C 5.6 10/04/2018      Assessment & Plan:   Problem List Items Addressed This Visit    None    Visit Diagnoses    Dizziness    -  Primary   Relevant Orders   Ambulatory referral to Neurology   Basic metabolic panel (Completed)   CBC (Completed)   Vertigo of central origin    -  Will obtain MRI Ambulatory referral for Neurology placed If MRI is denied Neurology can ordered imaging study   Relevant Orders   MR Brain Wo Contrast   Morbid obesity (HCC)    - pt advised to try to calorie restrict      No orders of the defined types were placed in this encounter.   Follow-up: No follow-ups on file.    Doristine BosworthZoe A Demoni Parmar, MD

## 2019-05-18 NOTE — Patient Instructions (Signed)
° ° ° °  If you have lab work done today you will be contacted with your lab results within the next 2 weeks.  If you have not heard from us then please contact us. The fastest way to get your results is to register for My Chart. ° ° °IF you received an x-ray today, you will receive an invoice from [facility]. Please contact [facility] at [phone] with questions or concerns regarding your invoice.  ° °IF you received labwork today, you will receive an invoice from [facility]. Please contact [facility] at [phone] with questions or concerns regarding your invoice.  ° °Our billing staff will not be able to assist you with questions regarding bills from these companies. ° °You will be contacted with the lab results as soon as they are available. The fastest way to get your results is to activate your My Chart account. Instructions are located on the last page of this paperwork. If you have not heard from us regarding the results in 2 weeks, please contact this office. °  ° ° ° °

## 2019-05-19 LAB — CBC
Hematocrit: 35.3 % (ref 34.0–46.6)
Hemoglobin: 11.5 g/dL (ref 11.1–15.9)
MCH: 28.5 pg (ref 26.6–33.0)
MCHC: 32.6 g/dL (ref 31.5–35.7)
MCV: 88 fL (ref 79–97)
Platelets: 236 10*3/uL (ref 150–450)
RBC: 4.03 x10E6/uL (ref 3.77–5.28)
RDW: 11.8 % (ref 11.7–15.4)
WBC: 4.7 10*3/uL (ref 3.4–10.8)

## 2019-05-19 LAB — BASIC METABOLIC PANEL
BUN/Creatinine Ratio: 7 — ABNORMAL LOW (ref 9–23)
BUN: 6 mg/dL (ref 6–20)
CO2: 23 mmol/L (ref 20–29)
Calcium: 8.9 mg/dL (ref 8.7–10.2)
Chloride: 106 mmol/L (ref 96–106)
Creatinine, Ser: 0.83 mg/dL (ref 0.57–1.00)
GFR calc Af Amer: 105 mL/min/{1.73_m2} (ref >59)
GFR calc non Af Amer: 91 mL/min/{1.73_m2} (ref >59)
Glucose: 91 mg/dL (ref 65–99)
Potassium: 4.4 mmol/L (ref 3.5–5.2)
Sodium: 140 mmol/L (ref 134–144)

## 2019-06-13 ENCOUNTER — Telehealth: Payer: Self-pay | Admitting: Family Medicine

## 2019-06-13 NOTE — Telephone Encounter (Signed)
Called patient to discuss her absences  She gets bouts of dizziness that has been lasting 1.5 days twice a week.  Created a letter to send to Heather [name] Will send for fax today

## 2019-06-14 NOTE — Telephone Encounter (Signed)
Letter sent via fax to [facility] and confirmation received. Dgaddy, CMA

## 2019-06-19 ENCOUNTER — Ambulatory Visit
Admission: RE | Admit: 2019-06-19 | Discharge: 2019-06-19 | Disposition: A | Discharge status: Home / Self Care | Payer: No Typology Code available for payment source | Source: Ambulatory Visit | Attending: Family Medicine | Admitting: Family Medicine

## 2019-06-19 ENCOUNTER — Other Ambulatory Visit: Payer: Self-pay

## 2019-06-19 DIAGNOSIS — H814 Vertigo of central origin: Secondary | ICD-10-CM

## 2019-07-14 ENCOUNTER — Ambulatory Visit: Payer: 59 | Admitting: Neurology

## 2020-01-04 NOTE — Progress Notes (Signed)
NEUROLOGY FOLLOW UP OFFICE NOTE  Heather [name] [id number]  HISTORY OF PRESENT ILLNESS: Heather [name] is a 39 year old woman with history of carpal tunnel syndrome who follows up for dizziness.  History supplemented by referring provider notes.  She has been experiencing dizziness since March 2020.  She has had occasional dizziness in the past but it has become more frequent.  Dizziness is described as a lightheaded sensation, however if she lays her head down on the side, she does have spinning.  She reports associated bifrontal head pressure and associated aural fullness. It often occurs if she starts thinking about it.  Nothing has been too effective to treat it.  It can last from a couple of minutes to all day.  It occurs daily. She was seen by ENT and treated for eustachian tube dysfunction with nasocort and zyrtec.  At that time, she had been experiencing cough and postnasal drip.  Seasonal allergies seem to make the dizziness worse.  She also has been experiencing palpitations.  Holter monitor was negative.  She does report tinnitus.  She denies associated hearing loss, double vision or unilateral numbness or weakness.  I saw her in June.  At that time, symptoms seemed consistent with eustachian tube dysfunction and maybe BPPV.  MRI of brain without contrast on 06/19/2019 personally reviewed showed 8 mm pineal gland cyst but otherwise unremarkable.  She returns for continued symptoms.  Labs from January 2021 show no evidence of anemia or electrolyte abnormality (CBC and CMP), Hgb A1c was 5.3, and vitamin D was 30.  Dizziness is improved but it comes and goes.  It now usually occurs twice a week, lasting a couple of hours prior to going to sleep.  Not positional .  It is most noticeable when she is preoccupied with it.  Eye twitching.  Eating.  Computer all day.  Tired.  Sleep helps.  no dizziness on weekend.  None in vacation.   Feels like she is tipsy.    She sometimes has headache usually  associated with her menstrual cycle.    Left sided above eye dull week before cycle.  Off and on for a few days prior to .    She also has history of anxiety and chronic pain.       PAST MEDICAL HISTORY: Past Medical History:  Diagnosis Date  . Anemia   . Carpal tunnel syndrome, left 06/2012  . Ganglion cyst 06/2012   left volar  . Herpes genitalia   . Postpartum care following vaginal delivery (11/6) 09/15/2016    MEDICATIONS: No current outpatient medications on file prior to visit.   No current facility-administered medications on file prior to visit.    ALLERGIES: Allergies  Allergen Reactions  . Meclizine Itching    FAMILY HISTORY: Family History  Problem Relation Age of Onset  . Hypertension Mother   . Hypertension Father   . Hyperlipidemia Father   . Breast cancer Maternal Aunt        pt unsure  . Breast cancer Maternal Uncle   . Breast cancer Maternal Aunt        pt unsure  .  SOCIAL HISTORY: Social History   Socioeconomic History  . Marital status: Married    Spouse name: Not on file  . Number of children: 2  . Years of education: Not on file  . Highest education level: Not on file  Occupational History  . Not on file  Tobacco Use  . Smoking status: Never  Smoker  . Smokeless tobacco: Never Used  Substance and Sexual Activity  . Alcohol use: No  . Drug use: No  . Sexual activity: Yes    Birth control/protection: None  Other Topics Concern  . Not on file  Social History Narrative   Right handed   Lives in two story home with husband and children   Works for [employer]   Social Determinants of Corporate investment banker Strain:   . Difficulty of Paying Living Expenses: Not on file  Food Insecurity:   . Worried About Programme researcher, broadcasting/film/video in the Last Year: Not on file  . Ran Out of Food in the Last Year: Not on file  Transportation Needs:   . Lack of Transportation (Medical): Not on file  . Lack of Transportation (Non-Medical): Not  on file  Physical Activity:   . Days of Exercise per Week: Not on file  . Minutes of Exercise per Session: Not on file  Stress:   . Feeling of Stress : Not on file  Social Connections:   . Frequency of Communication with Friends and Family: Not on file  . Frequency of Social Gatherings with Friends and Family: Not on file  . Attends Religious Services: Not on file  . Active Member of Clubs or Organizations: Not on file  . Attends Banker Meetings: Not on file  . Marital Status: Not on file  Intimate Partner Violence:   . Fear of Current or Ex-Partner: Not on file  . Emotionally Abused: Not on file  . Physically Abused: Not on file  . Sexually Abused: Not on file    REVIEW OF SYSTEMS: Constitutional: No fevers, chills, or sweats, no generalized fatigue, change in appetite Eyes: No visual changes, double vision, eye pain Ear, nose and throat: No hearing loss, ear pain, nasal congestion, sore throat Cardiovascular: No chest pain, palpitations Respiratory:  No shortness of breath at rest or with exertion, wheezes GastrointestinaI: No nausea, vomiting, diarrhea, abdominal pain, fecal incontinence Genitourinary:  No dysuria, urinary retention or frequency Musculoskeletal:  No neck pain, back pain Integumentary: No rash, pruritus, skin lesions Neurological: as above Psychiatric: No depression, insomnia, anxiety Endocrine: No palpitations, fatigue, diaphoresis, mood swings, change in appetite, change in weight, increased thirst Hematologic/Lymphatic:  No purpura, petechiae. Allergic/Immunologic: no itchy/runny eyes, nasal congestion, recent allergic reactions, rashes  PHYSICAL EXAM: Blood pressure 138/81, pulse (!) 102, resp. rate 16, height 5\' 8"  (1.727 m), weight 285 lb (129.3 kg), SpO2 96 %, currently breastfeeding. General: No acute distress.  Patient appears well-groomed.   Head:  Normocephalic/atraumatic Eyes:  Fundi examined but not visualized Neck: supple, no  paraspinal tenderness, full range of motion Heart:  Regular rate and rhythm Lungs:  Clear to auscultation bilaterally Back: No paraspinal tenderness Neurological Exam: alert and oriented to person, place, and time. Attention span and concentration intact, recent and remote memory intact, fund of knowledge intact.  Speech fluent and not dysarthric, language intact.  CN II-XII intact. Bulk and tone normal, muscle strength 5/5 throughout.  Sensation to light touch, temperature and vibration intact.  Deep tendon reflexes 2+ throughout, toes downgoing.  Finger to nose and heel to shin testing intact.  Gait normal, Romberg negative.  IMPRESSION: 1.  Menstrual related migraine, without status migrainosus, not intractable 2.  Dizziness, subjective.  Unclear if may be related to migraine. We will treat for migraine and see if dizziness resolves as well.  PLAN: 1.  Will start topiramate 25mg  at bedtime.  We can increase to 50mg  at bedtime in 4 to 6 weeks if needed. 2.  Limit use of pain relievers to no more than 2 days out of week to prevent risk of rebound or medication-overuse headache. 3.  Follow up in 4 months.  Metta Clines, DO  CC: Jordan Hawks, NP

## 2020-01-06 ENCOUNTER — Encounter: Payer: Self-pay | Admitting: Neurology

## 2020-01-06 ENCOUNTER — Other Ambulatory Visit: Payer: Self-pay

## 2020-01-06 ENCOUNTER — Ambulatory Visit (INDEPENDENT_AMBULATORY_CARE_PROVIDER_SITE_OTHER): Payer: 59 | Admitting: Neurology

## 2020-01-06 VITALS — BP 138/81 | HR 102 | Resp 16 | Ht 68.0 in | Wt 285.0 lb

## 2020-01-06 DIAGNOSIS — G43829 Menstrual migraine, not intractable, without status migrainosus: Secondary | ICD-10-CM | POA: Diagnosis not present

## 2020-01-06 DIAGNOSIS — J309 Allergic rhinitis, unspecified: Secondary | ICD-10-CM | POA: Insufficient documentation

## 2020-01-06 DIAGNOSIS — D649 Anemia, unspecified: Secondary | ICD-10-CM | POA: Insufficient documentation

## 2020-01-06 DIAGNOSIS — R42 Dizziness and giddiness: Secondary | ICD-10-CM

## 2020-01-06 MED ORDER — TOPIRAMATE 25 MG PO TABS: 25.0000 mg | ORAL_TABLET | Freq: Every day | ORAL | 0 refills | Status: DC

## 2020-01-06 NOTE — Patient Instructions (Signed)
1. Start topiramate 25mg  at bedtime.  Contact me when you have about 5 pills left with update and I will refill or increase dose. 2.  Follow up in 4 months virtual visit

## 2020-01-18 ENCOUNTER — Encounter: Payer: Self-pay | Admitting: Family Medicine

## 2020-01-30 ENCOUNTER — Encounter: Payer: Self-pay | Admitting: Family Medicine

## 2020-02-27 ENCOUNTER — Other Ambulatory Visit: Payer: Self-pay

## 2020-02-27 ENCOUNTER — Encounter: Payer: Self-pay | Admitting: Family Medicine

## 2020-02-27 ENCOUNTER — Telehealth (INDEPENDENT_AMBULATORY_CARE_PROVIDER_SITE_OTHER): Payer: 59 | Admitting: Family Medicine

## 2020-02-27 NOTE — Progress Notes (Signed)
Established Patient Office Visit  Virtual Visit via Video Note  I connected with Heather [name] on 02/27/20 at  5:00 PM EDT by a video enabled telemedicine application and verified that I am speaking with the correct person using two identifiers.  Location: Patient: home Provider: office   I discussed the limitations of evaluation and management by telemedicine and the availability of in person appointments. The patient expressed understanding and agreed to proceed.    Subjective:  Patient ID: Heather [name], female    DOB: 02/12/1981  Age: 39 y.o. MRN: [id number]  CC:  Chief Complaint  Patient presents with  . Dizziness    per pt has worsened and is not as bad when lying down. Pt concerned about increased hr when lying down.  Thinking about the dizziness seems to exacerbate it but if she is active exercising she doesn't have the dizziness.  Per pt when she is inactive is when she has the dizziness more because she tends to think about it more.  Per pt when she takes b complex she has to drink lots of water so she won't be dizzy.    HPI Heather [name] presents for   She reports that she eats a diet that is high in fat and sugar.  She eats chick fil A sandwiches and eats some cookies.  She is drinking calming tea. She states that she gets dizziness since gaining 30 pounds since January 2021.  She was 265 in January 2021 and gained 30 pounds.  She reports that they eat portion controlled meals in the week and on the weekends they eat what they want.   Wt Readings from Last 3 Encounters:  02/27/20 295 lb (133.8 kg)  01/06/20 285 lb (129.3 kg)  05/18/19 265 lb 12.8 oz (120.6 kg)     Past Medical History:  Diagnosis Date  . Anemia   . Carpal tunnel syndrome, left 06/2012  . Ganglion cyst 06/2012   left volar  . Herpes genitalia   . Postpartum care following vaginal delivery (11/6) 09/15/2016    Past Surgical History:  Procedure Laterality Date  . CARPAL TUNNEL  RELEASE  06/28/2012   Procedure: CARPAL TUNNEL RELEASE;  Surgeon: Tami Ribas, MD;  Location: [facility];  Service: Orthopedics;  Laterality: Left;  EXCISION VOLAR GANGLION CYST and carpal tunnel release left timeouts for ganglion cyst done at same time as timeouts for carpal tunnel with same staffs  . WISDOM TOOTH EXTRACTION      Family History  Problem Relation Age of Onset  . Hypertension Mother   . Hypertension Father   . Hyperlipidemia Father   . Breast cancer Maternal Aunt        pt unsure  . Breast cancer Maternal Uncle   . Breast cancer Maternal Aunt        pt unsure    Social History   Socioeconomic History  . Marital status: Married    Spouse name: Not on file  . Number of children: 2  . Years of education: Not on file  . Highest education level: Not on file  Occupational History  . Not on file  Tobacco Use  . Smoking status: Never Smoker  . Smokeless tobacco: Never Used  Substance and Sexual Activity  . Alcohol use: No  . Drug use: No  . Sexual activity: Yes    Birth control/protection: None  Other Topics Concern  . Not on file  Social History Narrative   Right handed  Lives in two story home with husband and children   Works for [employer] Strain:   . Difficulty of Paying Living Expenses:   Food Insecurity:   . Worried About Programme researcher, broadcasting/film/video in the Last Year:   . Barista in the Last Year:   Transportation Needs:   . Freight forwarder (Medical):   Marland Kitchen Lack of Transportation (Non-Medical):   Physical Activity:   . Days of Exercise per Week:   . Minutes of Exercise per Session:   Stress:   . Feeling of Stress :   Social Connections:   . Frequency of Communication with Friends and Family:   . Frequency of Social Gatherings with Friends and Family:   . Attends Religious Services:   . Active Member of Clubs or Organizations:   . Attends Banker  Meetings:   Marland Kitchen Marital Status:   Intimate Partner Violence:   . Fear of Current or Ex-Partner:   . Emotionally Abused:   Marland Kitchen Physically Abused:   . Sexually Abused:     Outpatient Medications Prior to Visit  Medication Sig Dispense Refill  . calcium-vitamin D (OSCAL WITH D) 500-200 MG-UNIT tablet Take 1 tablet by mouth.    . Multiple Vitamin (MULTIVITAMIN) tablet Take 1 tablet by mouth daily.    Marland Kitchen topiramate (TOPAMAX) 25 MG tablet Take 1 tablet (25 mg total) by mouth at bedtime. (Patient not taking: Reported on 02/27/2020) 45 tablet 0   No facility-administered medications prior to visit.    Allergies  Allergen Reactions  . Meclizine Itching    ROS Review of Systems    Objective:    Physical Exam  Wt 295 lb (133.8 kg)   LMP 02/03/2020   BMI 44.85 kg/m  Wt Readings from Last 3 Encounters:  02/27/20 295 lb (133.8 kg)  01/06/20 285 lb (129.3 kg)  05/18/19 265 lb 12.8 oz (120.6 kg)   Physical Exam  Constitutional: Oriented to person, place, and time. Appears well-developed and well-nourished.  HENT:  Head: Normocephalic and atraumatic.  Eyes: Conjunctivae and EOM are normal.  No murmur heard. Pulmonary/Chest: normal effort  Neurological: Is alert and oriented to person, place, and time.  Skin: Skin is warm. Capillary refill takes less than 2 seconds.  Psychiatric: Has a normal mood and affect. Behavior is normal. Judgment and thought content normal.    Health Maintenance Due  Topic Date Due  . COVID-19 Vaccine (1) Never done  . PAP SMEAR-Modifier  10/11/2019    There are no preventive care reminders to display for this patient.  Lab Results  Component Value Date   TSH 1.160 10/04/2018   Lab Results  Component Value Date   WBC 4.7 05/18/2019   HGB 11.5 05/18/2019   HCT 35.3 05/18/2019   MCV 88 05/18/2019   PLT 236 05/18/2019   Lab Results  Component Value Date   NA 140 05/18/2019   K 4.4 05/18/2019   CO2 23 05/18/2019   GLUCOSE 91 05/18/2019   BUN  6 05/18/2019   CREATININE 0.83 05/18/2019   BILITOT 0.5 02/01/2019   ALKPHOS 58 02/01/2019   AST 16 02/01/2019   ALT 9 02/01/2019   PROT 6.8 02/01/2019   ALBUMIN 4.2 02/01/2019   CALCIUM 8.9 05/18/2019   Lab Results  Component Value Date   CHOL 155 08/10/2017   Lab Results  Component Value Date   HDL 56 08/10/2017  Lab Results  Component Value Date   LDLCALC 90 08/10/2017   Lab Results  Component Value Date   TRIG 46 08/10/2017   Lab Results  Component Value Date   CHOLHDL 2.8 08/10/2017   Lab Results  Component Value Date   HGBA1C 5.6 10/04/2018      Assessment & Plan:   Problem List Items Addressed This Visit    None    Visit Diagnoses    Morbid obesity ([place])    -  Primary    -   Advised pt to try [facility] MD -   She should follow up about her dizziness    No orders of the defined types were placed in this encounter.    Follow Up Instructions:    I discussed the assessment and treatment plan with the patient. The patient was provided an opportunity to ask questions and all were answered. The patient agreed with the plan and demonstrated an understanding of the instructions.   The patient was advised to call back or seek an in-person evaluation if the symptoms worsen or if the condition fails to improve as anticipated.  I provided 15 minutes of non-face-to-face time during this encounter.   Forrest Moron, MD   Forrest Moron, MD

## 2020-02-27 NOTE — Patient Instructions (Signed)
° ° ° °  If you have lab work done today you will be contacted with your lab results within the next 2 weeks.  If you have not heard from us then please contact us. The fastest way to get your results is to register for My Chart. ° ° °IF you received an x-ray today, you will receive an invoice from [facility]. Please contact [facility] at [phone] with questions or concerns regarding your invoice.  ° °IF you received labwork today, you will receive an invoice from [facility]. Please contact [facility] at [phone] with questions or concerns regarding your invoice.  ° °Our billing staff will not be able to assist you with questions regarding bills from these companies. ° °You will be contacted with the lab results as soon as they are available. The fastest way to get your results is to activate your My Chart account. Instructions are located on the last page of this paperwork. If you have not heard from us regarding the results in 2 weeks, please contact this office. °  ° ° ° °

## 2020-03-20 ENCOUNTER — Encounter: Payer: Self-pay | Admitting: Family Medicine

## 2020-04-30 NOTE — Progress Notes (Unsigned)
Virtual Visit via Video Note The purpose of this virtual visit is to provide medical care while limiting exposure to the novel coronavirus.    Consent was obtained for video visit:  Yes.   Answered questions that patient had about telehealth interaction:  Yes.   I discussed the limitations, risks, security and privacy concerns of performing an evaluation and management service by telemedicine. I also discussed with the patient that there may be a patient responsible charge related to this service. The patient expressed understanding and agreed to proceed.  Pt location: Home Physician Location: office Name of referring provider:  Jordan Hawks, NP I connected with Heather [name] at patients initiation/request on 05/02/2020 at 10:30 AM EDT by video enabled telemedicine application and verified that I am speaking with the correct person using two identifiers. Pt MRN:  [id number] Pt DOB:  1981-01-23 Video Participants:  Heather [name];    History of Present Illness:  Heather [name] is a 39 year old woman with history of carpal tunnel syndrome who follows up for dizziness.  UPDATE: Started topiramate in February to treat menstrual migraines and possible migraine-related dizziness.  ***  Current antiepileptic medication:  topiramate 25mg  at bedtime  HISTORY: She has been experiencing dizziness since March 2020.  She has had occasional dizziness in the past but it has become more frequent.  Dizziness is described as a lightheaded sensation, however if she lays her head down on the side, she does have spinning.  She reports associated bifrontal head pressure and associated aural fullness. It often occurs if she starts thinking about it.  Nothing has been too effective to treat it.  It can last from a couple of minutes to all day.  It occurs daily. She was seen by ENT and treated for eustachian tube dysfunction with nasocort and zyrtec.  At that time, she had been experiencing cough and  postnasal drip.  Seasonal allergies seem to make the dizziness worse.  She also has been experiencing palpitations.  Holter monitor was negative.  She does report tinnitus.  She denies associated hearing loss, double vision or unilateral numbness or weakness.  I saw her in June.  At that time, symptoms seemed consistent with eustachian tube dysfunction and maybe BPPV.  MRI of brain without contrast on 06/19/2019 personally reviewed showed 8 mm pineal gland cyst but otherwise unremarkable.  She returns for continued symptoms.  Labs from January 2021 show no evidence of anemia or electrolyte abnormality (CBC and CMP), Hgb A1c was 5.3, and vitamin D was 30.  Dizziness is improved but it comes and goes.  It now usually occurs twice a week, lasting a couple of hours prior to going to sleep.  Not positional .  It is most noticeable when she is preoccupied with it.  Eye twitching.  Eating.  Computer all day.  Tired.  Sleep helps.  no dizziness on weekend.  None in vacation.   Feels like she is tipsy.    She sometimes has headache usually associated with her menstrual cycle.    Left sided above eye dull week before cycle.  Off and on for a few days prior to .    She also has history of anxiety and chronic pain.    Past Medical History: Past Medical History:  Diagnosis Date  . Anemia   . Carpal tunnel syndrome, left 06/2012  . Ganglion cyst 06/2012   left volar  . Herpes genitalia   . Postpartum care following vaginal delivery (11/6) 09/15/2016  Medications: Outpatient Encounter Medications as of 05/02/2020  Medication Sig  . calcium-vitamin D (OSCAL WITH D) 500-200 MG-UNIT tablet Take 1 tablet by mouth.  . Multiple Vitamin (MULTIVITAMIN) tablet Take 1 tablet by mouth daily.  Marland Kitchen topiramate (TOPAMAX) 25 MG tablet Take 1 tablet (25 mg total) by mouth at bedtime. (Patient not taking: Reported on 02/27/2020)   No facility-administered encounter medications on file as of 05/02/2020.     Allergies: Allergies  Allergen Reactions  . Meclizine Itching    Family History: Family History  Problem Relation Age of Onset  . Hypertension Mother   . Hypertension Father   . Hyperlipidemia Father   . Breast cancer Maternal Aunt        pt unsure  . Breast cancer Maternal Uncle   . Breast cancer Maternal Aunt        pt unsure    Social History: Social History   Socioeconomic History  . Marital status: Married    Spouse name: Not on file  . Number of children: 2  . Years of education: Not on file  . Highest education level: Not on file  Occupational History  . Not on file  Tobacco Use  . Smoking status: Never Smoker  . Smokeless tobacco: Never Used  Vaping Use  . Vaping Use: Never used  Substance and Sexual Activity  . Alcohol use: No  . Drug use: No  . Sexual activity: Yes    Birth control/protection: None  Other Topics Concern  . Not on file  Social History Narrative   Right handed   Lives in two story home with husband and children   Works for [employer]   Social Determinants of Corporate investment banker Strain:   . Difficulty of Paying Living Expenses:   Food Insecurity:   . Worried About Programme researcher, broadcasting/film/video in the Last Year:   . Barista in the Last Year:   Transportation Needs:   . Freight forwarder (Medical):   Marland Kitchen Lack of Transportation (Non-Medical):   Physical Activity:   . Days of Exercise per Week:   . Minutes of Exercise per Session:   Stress:   . Feeling of Stress :   Social Connections:   . Frequency of Communication with Friends and Family:   . Frequency of Social Gatherings with Friends and Family:   . Attends Religious Services:   . Active Member of Clubs or Organizations:   . Attends Banker Meetings:   Marland Kitchen Marital Status:   Intimate Partner Violence:   . Fear of Current or Ex-Partner:   . Emotionally Abused:   Marland Kitchen Physically Abused:   . Sexually Abused:     Observations/Objective:    *** No acute distress.  Alert and oriented.  Speech fluent and not dysarthric.  Language intact.  Eyes orthophoric on primary gaze.  Face symmetric.  Assessment and Plan:   1.  Menstrual related migraine 2.  Dizziness, subjective.   ***  Follow Up Instructions:    -I discussed the assessment and treatment plan with the patient. The patient was provided an opportunity to ask questions and all were answered. The patient agreed with the plan and demonstrated an understanding of the instructions.   The patient was advised to call back or seek an in-person evaluation if the symptoms worsen or if the condition fails to improve as anticipated.    Cira Servant, DO

## 2020-05-02 ENCOUNTER — Telehealth: Payer: 59 | Admitting: Neurology

## 2020-05-02 ENCOUNTER — Other Ambulatory Visit: Payer: Self-pay

## 2020-10-21 ENCOUNTER — Other Ambulatory Visit: Payer: Self-pay

## 2020-11-04 IMAGING — MR MRI HEAD WITHOUT CONTRAST
10 series · 48 of 48 positions shown · non-contrast
Comparison: None available.

CLINICAL DATA: Initial evaluation for persistent vertigo,
dizziness.

EXAM:
MRI HEAD WITHOUT CONTRAST
TECHNIQUE: Multiplanar, multiecho pulse sequences of the brain and surrounding
structures were obtained without intravenous contrast.

[Series 2: t1_se_sag · sagittal · 5.0mm · 0.45mm/px · 1 of 21 slices shown]
[im 1/21]
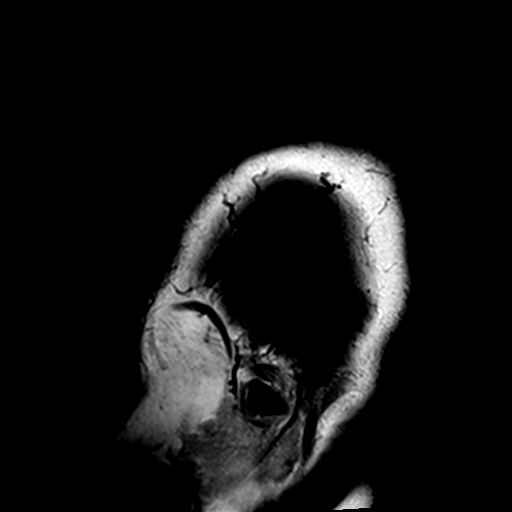

[Series 3: ep2d_diff_3 · axial · 3.0mm · 1.80mm/px · z∈[-67,+70]mm · 9 of 92 slices shown]
[im 1/92]
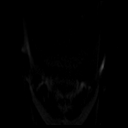
[im 12/92]
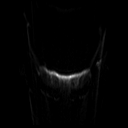
[im 23/92]
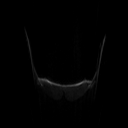
[im 35/92]
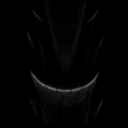
[im 46/92]
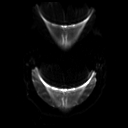
[im 57/92]
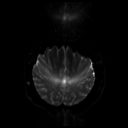
[im 69/92]
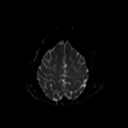
[im 80/92]
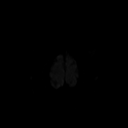
[im 92/92]
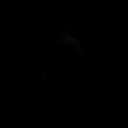

[Series 4: ep2d_diff_3_adc · axial · 3.0mm · 1.80mm/px · z∈[-67,+70]mm · 5 of 48 slices shown]
[im 1/48]
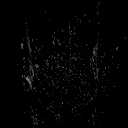
[im 12/48]
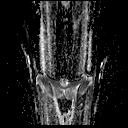
[im 24/48]
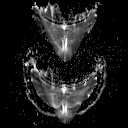
[im 36/48]
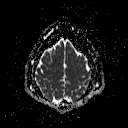
[im 48/48]
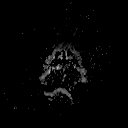

[Series 5: ep2d_diff_cor · coronal · 5.0mm · 1.77mm/px · 5 of 49 slices shown]
[im 1/49]
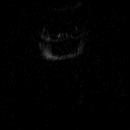
[im 13/49]
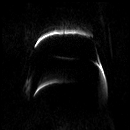
[im 25/49]
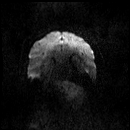
[im 37/49]
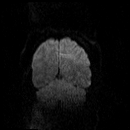
[im 49/49]
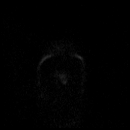

[Series 6: ep2d_diff_cor_adc · coronal · 5.0mm · 1.77mm/px · 3 of 27 slices shown]
[im 1/27]
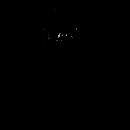
[im 14/27]
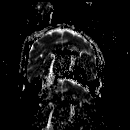
[im 27/27]
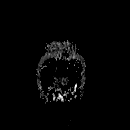

[Series 9: FLAIR · axial · 3.0mm · 0.43mm/px · z∈[-63,+71]mm · 4 of 47 slices shown]
[im 1/47]
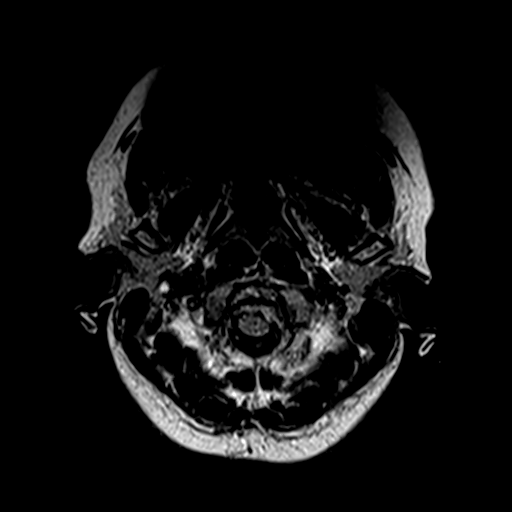
[im 16/47]
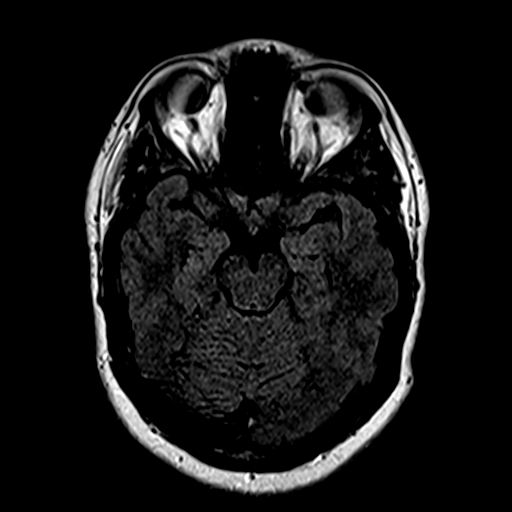
[im 31/47]
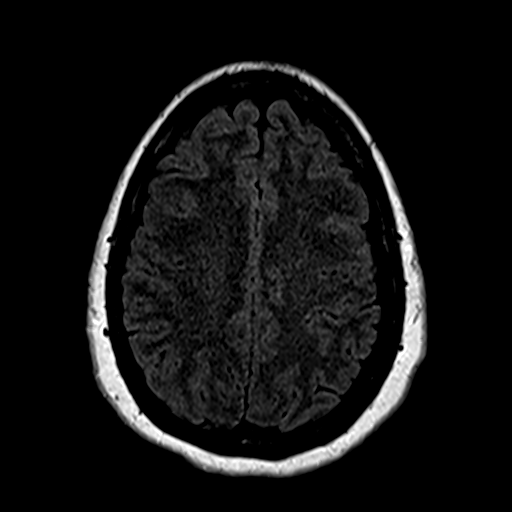
[im 47/47]
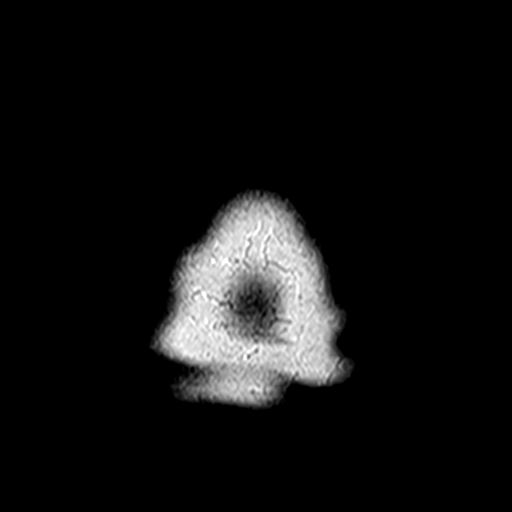

[Series 10: t2_tse_tra_512 · axial · 5.0mm · 0.60mm/px · z∈[-63,+71]mm · 2 of 24 slices shown]
[im 1/24]
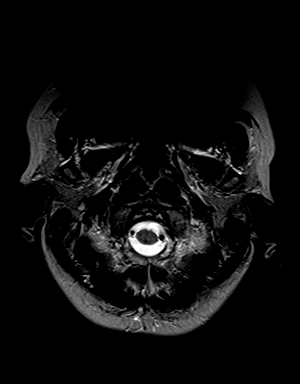
[im 24/24]
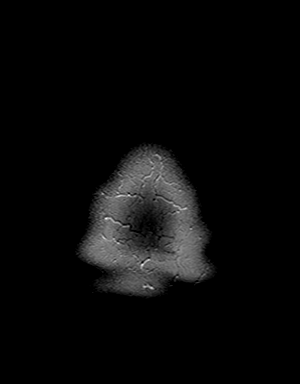

[Series 11: t1_mpr_tra · axial · 1.0mm · 0.72mm/px · z∈[-66,+73]mm · 14 of 144 slices shown]
[im 1/144]
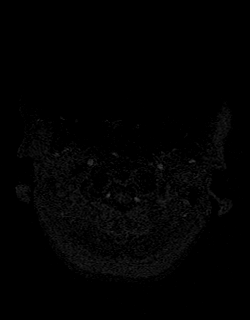
[im 12/144]
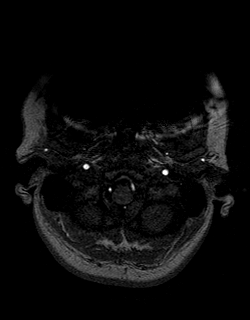
[im 23/144]
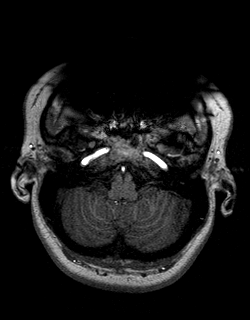
[im 34/144]
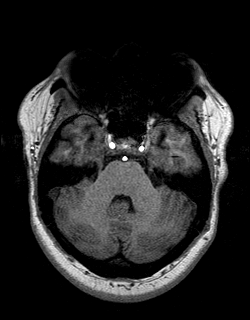
[im 45/144]
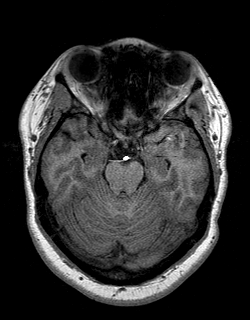
[im 56/144]
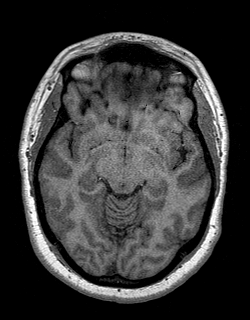
[im 67/144]
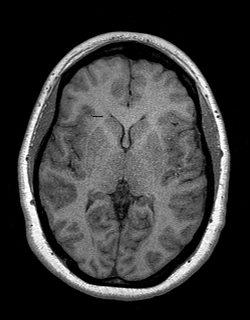
[im 78/144]
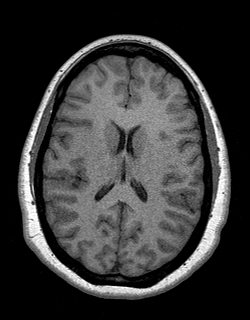
[im 89/144]
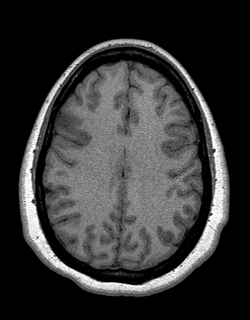
[im 100/144]
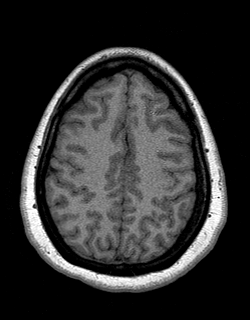
[im 111/144]
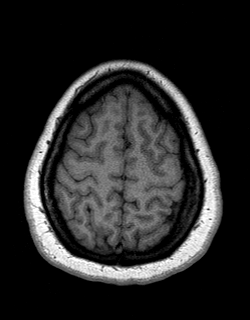
[im 122/144]
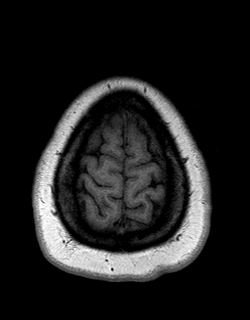
[im 133/144]
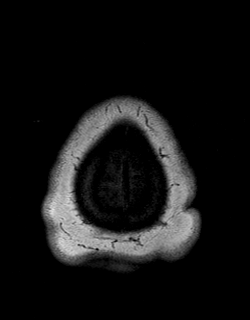
[im 144/144]
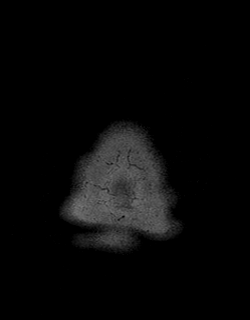

[Series 12: T2 · coronal · 5.0mm · 0.45mm/px · 3 of 28 slices shown]
[im 1/28]
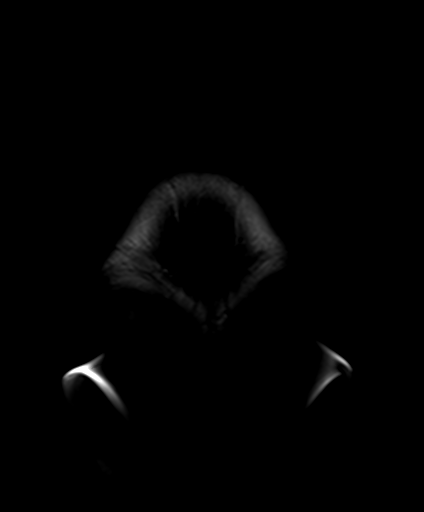
[im 14/28]
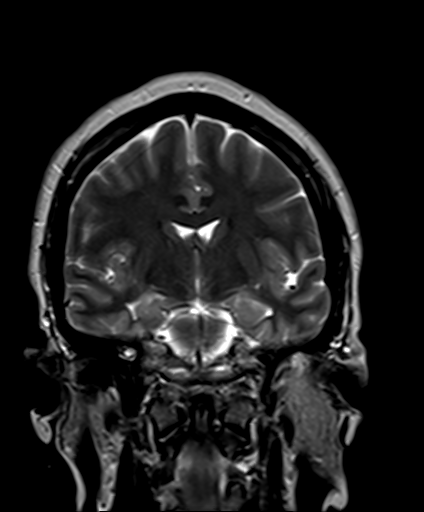
[im 28/28]
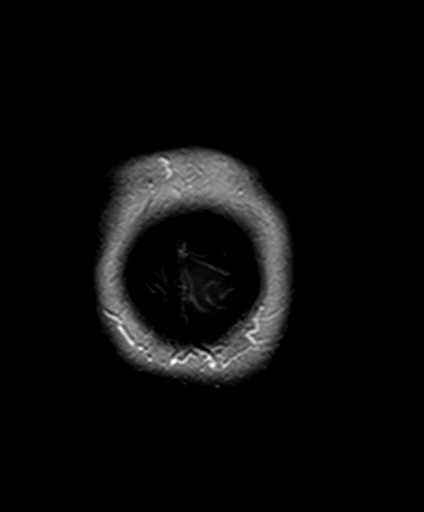

[Series 13: GRE · axial · 5.0mm · 0.45mm/px · z∈[-63,+70]mm · 2 of 22 slices shown]
[im 1/22]
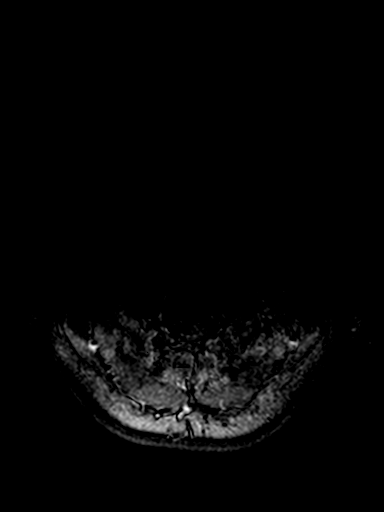
[im 22/22]
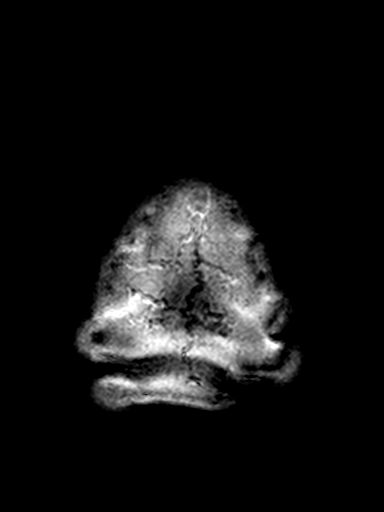

[48 of 48 positions shown; findings below may reference images not displayed]

FINDINGS: Brain: Examination technically limited due to extensive
susceptibility artifact from oral hardware.

Cerebral volume within normal limits. No focal parenchymal signal
abnormality. No definite abnormal foci of restricted diffusion to
suggest acute or subacute ischemia. Gray-white matter
differentiation maintained. No encephalomalacia to suggest chronic
cortical infarction. No foci of susceptibility artifact to suggest
acute or chronic intracranial hemorrhage.

No mass lesion, midline shift or mass effect. No hydrocephalus. No
extra-axial fluid collection. Pituitary gland suprasellar region
normal. Midline structures intact and normal. Incidental note made
of a 8 mm simple cyst within the pineal gland (series 10, image 11).

Vascular: Major intracranial vascular flow voids are grossly
maintained at the skull base.

Skull and upper cervical spine: Craniocervical junction within
normal limits. Upper cervical spine normal. Bone marrow signal
intensity within normal limits. No scalp soft tissue abnormality.

Sinuses/Orbits: Globes and orbital soft tissues grossly within
normal limits, although evaluation limited by susceptibility
artifact. Similarly, paranasal sinuses are not well assessed, but
are grossly clear. No significant mastoid effusion. Inner ear
structures grossly normal.

Other: None.
IMPRESSION: 1. Normal brain MRI. No acute intracranial abnormality or findings
to explain patient's symptoms identified.
2. 8 mm simple cyst within the pineal gland. Finding felt to be
incidental in nature, and of doubtful clinical significance.

## 2022-02-17 ENCOUNTER — Encounter (HOSPITAL_BASED_OUTPATIENT_CLINIC_OR_DEPARTMENT_OTHER): Payer: Self-pay

## 2022-02-17 ENCOUNTER — Emergency Department (HOSPITAL_BASED_OUTPATIENT_CLINIC_OR_DEPARTMENT_OTHER)
Admission: EM | Admit: 2022-02-17 | Discharge: 2022-02-18 | Disposition: A | Discharge status: Home / Self Care | Payer: 59 | Attending: Emergency Medicine | Admitting: Emergency Medicine

## 2022-02-17 ENCOUNTER — Other Ambulatory Visit: Payer: Self-pay

## 2022-02-17 DIAGNOSIS — R42 Dizziness and giddiness: Secondary | ICD-10-CM | POA: Insufficient documentation

## 2022-02-17 DIAGNOSIS — D649 Anemia, unspecified: Secondary | ICD-10-CM | POA: Diagnosis not present

## 2022-02-17 DIAGNOSIS — R232 Flushing: Secondary | ICD-10-CM | POA: Diagnosis not present

## 2022-02-17 DIAGNOSIS — R002 Palpitations: Secondary | ICD-10-CM | POA: Insufficient documentation

## 2022-02-17 NOTE — ED Triage Notes (Signed)
Patient here POV from Home. ? ?Patient endorses recent History of Intermittent Dizziness and Palpitations for the Past Three Years but patient states she was at the [facility] relaxing today when she felt another onset of Same. ? ?No Fevers. Mild Nausea this AM. No Emesis. No Diarrhea. No CP. No SOB ? ?NAD Noted during Triage. A&Ox4. GCS 15. Ambulatory. ?

## 2022-02-18 LAB — COMPREHENSIVE METABOLIC PANEL
ALT: 10 U/L (ref 0–44)
AST: 13 U/L — ABNORMAL LOW (ref 15–41)
Albumin: 4.3 g/dL (ref 3.5–5.0)
Alkaline Phosphatase: 51 U/L (ref 38–126)
Anion gap: 10 (ref 5–15)
BUN: 11 mg/dL (ref 6–20)
CO2: 24 mmol/L (ref 22–32)
Calcium: 9.7 mg/dL (ref 8.9–10.3)
Chloride: 104 mmol/L (ref 98–111)
Creatinine, Ser: 0.67 mg/dL (ref 0.44–1.00)
GFR, Estimated: >60 mL/min (ref >60)
Glucose, Bld: 101 mg/dL — ABNORMAL HIGH (ref 70–99)
Potassium: 3.5 mmol/L (ref 3.5–5.1)
Sodium: 138 mmol/L (ref 135–145)
Total Bilirubin: 0.4 mg/dL (ref 0.3–1.2)
Total Protein: 7.6 g/dL (ref 6.5–8.1)

## 2022-02-18 LAB — CBC WITH DIFFERENTIAL/PLATELET
Abs Immature Granulocytes: 0.02 10*3/uL (ref 0.00–0.07)
Basophils Absolute: 0 10*3/uL (ref 0.0–0.1)
Basophils Relative: 0 %
Eosinophils Absolute: 0.1 10*3/uL (ref 0.0–0.5)
Eosinophils Relative: 2 %
HCT: 34.6 % — ABNORMAL LOW (ref 36.0–46.0)
Hemoglobin: 10.9 g/dL — ABNORMAL LOW (ref 12.0–15.0)
Immature Granulocytes: 0 %
Lymphocytes Relative: 30 %
Lymphs Abs: 2.1 10*3/uL (ref 0.7–4.0)
MCH: 28.2 pg (ref 26.0–34.0)
MCHC: 31.5 g/dL (ref 30.0–36.0)
MCV: 89.4 fL (ref 80.0–100.0)
Monocytes Absolute: 0.5 10*3/uL (ref 0.1–1.0)
Monocytes Relative: 7 %
Neutro Abs: 4.2 10*3/uL (ref 1.7–7.7)
Neutrophils Relative %: 61 %
Platelets: 237 10*3/uL (ref 150–400)
RBC: 3.87 MIL/uL (ref 3.87–5.11)
RDW: 12.7 % (ref 11.5–15.5)
WBC: 7 10*3/uL (ref 4.0–10.5)
nRBC: 0 % (ref 0.0–0.2)

## 2022-02-18 LAB — HCG, QUANTITATIVE, PREGNANCY: hCG, Beta Chain, Quant, S: <1 m[IU]/mL (ref <5)

## 2022-02-18 LAB — CBG MONITORING, ED: Glucose-Capillary: 95 mg/dL (ref 70–99)

## 2022-02-18 NOTE — ED Provider Notes (Signed)
?[facility] EMERGENCY DEPT ?Provider Note ? ?CSN: [id number] ?Arrival date & time: 02/17/22 2223 ? ?Chief Complaint(s) ?Dizziness ? ?HPI ?Heather [name] is a 41 y.o. female with a past medical history listed below who presents to the emergency department for lightheadedness, palpitations, and flushing. ?Episode occurred while getting her hair braided at 6pm. ?Lasted a few minutes. Resolved on it's own. Has not returned. Eating and drinking fine since. ?Patient has had similar episodes for the past 3 yrs. ?No associated fever, cough, chest pain, SOB, visual disturbance, focal deficits. ? ?Started her menstrual cycle. ? ?The history is provided by the patient.  ? ?Past Medical History ?Past Medical History:  ?Diagnosis Date  ? Anemia   ? Carpal tunnel syndrome, left 06/2012  ? Ganglion cyst 06/2012  ? left volar  ? Herpes genitalia   ? Postpartum care following vaginal delivery (11/6) 09/15/2016  ? ?Patient Active Problem List  ? Diagnosis Date Noted  ? Morbid obesity with BMI of 40.0-44.9, adult (HCC) 01/06/2020  ? Allergic rhinitis 01/06/2020  ? Anemia 01/06/2020  ? Eustachian tube dysfunction, bilateral 04/25/2019  ? Rheumatoid factor positive 10/09/2018  ? Chronic pain of both knees 10/09/2018  ? Clavicle pain 10/09/2018  ? Chronic left shoulder pain 10/09/2018  ? Chronic left-sided thoracic back pain 10/09/2018  ? Class 3 severe obesity due to excess calories with serious comorbidity and body mass index (BMI) of 40.0 to 44.9 in adult [facility]) 10/09/2018  ? Arthralgia of multiple joints 10/09/2018  ? Vitamin D deficiency 08/11/2017  ? Left carpal tunnel syndrome 03/06/2017  ? Abnormal glucose level 08/16/2015  ? ?Home Medication(s) ?Prior to Admission medications   ?Medication Sig Start Date End Date Taking? Authorizing Provider  ?calcium-vitamin D (OSCAL WITH D) 500-200 MG-UNIT tablet Take 1 tablet by mouth.    [provider]  ?Multiple Vitamin (MULTIVITAMIN) tablet Take 1 tablet by mouth  daily.    [provider]  ?topiramate (TOPAMAX) 25 MG tablet Take 1 tablet (25 mg total) by mouth at bedtime. ?Patient not taking: Reported on 02/27/2020 01/06/20   Drema Dallas, DO  ?                                                                                                                                  ?Allergies ?Meclizine ? ?Review of Systems ?Review of Systems ?As noted in HPI ? ?Physical Exam ?Vital Signs  ?I have reviewed the triage vital signs ?BP 121/70   Pulse 62   Temp 97.9 ?F (36.6 ?C) (Oral)   Resp 10   Ht 5\' 8"  (1.727 m)   Wt 133.8 kg   SpO2 100%   BMI 44.85 kg/m?  ? ?Physical Exam ?Vitals reviewed.  ?Constitutional:   ?   General: She is not in acute distress. ?   Appearance: She is well-developed. She is obese. She is not diaphoretic.  ?HENT:  ?   Head: Normocephalic and atraumatic.  ?  Nose: Nose normal.  ?Eyes:  ?   General: No scleral icterus.    ?   Right eye: No discharge.     ?   Left eye: No discharge.  ?   Conjunctiva/sclera: Conjunctivae normal.  ?   Pupils: Pupils are equal, round, and reactive to light.  ?Cardiovascular:  ?   Rate and Rhythm: Normal rate and regular rhythm.  ?   Heart sounds: No murmur heard. ?  No friction rub. No gallop.  ?Pulmonary:  ?   Effort: Pulmonary effort is normal. No respiratory distress.  ?   Breath sounds: Normal breath sounds. No stridor. No rales.  ?Abdominal:  ?   General: There is no distension.  ?   Palpations: Abdomen is soft.  ?   Tenderness: There is no abdominal tenderness.  ?Musculoskeletal:     ?   General: No tenderness.  ?   Cervical back: Normal range of motion and neck supple.  ?Skin: ?   General: Skin is warm and dry.  ?   Findings: No erythema or rash.  ?Neurological:  ?   Mental Status: She is alert and oriented to person, place, and time.  ? ? ?ED Results and Treatments ?Labs ?(all labs ordered are listed, but only abnormal results are displayed) ?Labs Reviewed  ?CBC WITH DIFFERENTIAL/PLATELET - Abnormal; Notable  for the following components:  ?    Result Value  ? Hemoglobin 10.9 (*)   ? HCT 34.6 (*)   ? All other components within normal limits  ?COMPREHENSIVE METABOLIC PANEL - Abnormal; Notable for the following components:  ? Glucose, Bld 101 (*)   ? AST 13 (*)   ? All other components within normal limits  ?HCG, QUANTITATIVE, PREGNANCY  ?CBG MONITORING, ED  ?                                                                                                                       ?EKG ? EKG Interpretation ? ?Date/Time:  Monday February 17 2022 22:46:35 EDT ?Ventricular Rate:  71 ?PR Interval:  162 ?QRS Duration: 84 ?QT Interval:  382 ?QTC Calculation: 415 ?R Axis:   77 ?Text Interpretation: Normal sinus rhythm with sinus arrhythmia Nonspecific T wave abnormality Abnormal ECG When compared with ECG of 23-Jul-2017 19:06, Non-specific change in ST segment in Anterior leads Nonspecific T wave abnormality, worse in Anterior leads Confirmed by Drema Pry [phone]) on 02/18/2022 1:12:25 AM ?  ? ?  ? ?Radiology ?No results found. ? ?Pertinent labs & imaging results that were available during my care of the patient were reviewed by me and considered in my medical decision making (see MDM for details). ? ?Medications Ordered in ED ?Medications - No data to display                                                               ?                                                                    ?  Procedures ?Procedures ? ?(including critical care time) ? ?Medical Decision Making / ED Course ? ? ? Complexity of Problem: ? ?Patient's presenting problem/concern, DDX, and MDM listed below: ?Lightheadedness, palpitations, flushing ?DDx: Vasovagal episode, dysrhythmia, anemia. ?Will also assess for electrolyte or metabolic derangements and pregnancy ?Not consistent with ACS, CVA. ? ?Hospitalization Considered:  ?no ? ?Initial Intervention:  ?N/a ? ?  Complexity of Data: ?  ?Cardiac Monitoring: ?The patient was maintained on a cardiac monitor.    ?I personally viewed and interpreted the cardiac monitored which showed an underlying rhythm of NSR in 70s no dysrhyhtmia  ? ?Laboratory Tests ordered listed below with my independent interpretation: ?CBC w/o leukocytosis. Mild anemia, but close to baseline ?No electrolyte derangements, or AKI.  ?HCG negative ?  ?Imaging Studies ordered listed below with my independent interpretation: ?Not needed ?  ?  ?ED Course:   ? ?Assessment, Add'l Intervention, and Reassessment: ?Lightheadedness ?Likely vasovagal episode ?HDS ? ? ?Final Clinical Impression(s) / ED Diagnoses ?Final diagnoses:  ?Dizziness  ?Flushing  ? ?The patient appears reasonably screened and/or stabilized for discharge and I doubt any other medical condition or other [facility] requiring further screening, evaluation, or treatment in the ED at this time prior to discharge. Safe for discharge with strict return precautions. ? ?Disposition: Discharge ? ?Condition: Good ? ?I have discussed the results, Dx and Tx plan with the patient/family who expressed understanding and agree(s) with the plan. Discharge instructions discussed at length. The patient/family was given strict return precautions who verbalized understanding of the instructions. No further questions at time of discharge.  ? ? ?ED Discharge Orders   ? ? None  ? ?  ? ? ?Follow Up: ?Georgiann CockerStrup, Kathryn, FNP ?[address] ?[place] [id number] ?[phone] ? ?Call  ?to schedule an appointment for close follow up ? ? ? ?  ? ? ? ? ? ?This chart was dictated using voice recognition software.  Despite best efforts to proofread,  errors can occur which can change the documentation meaning. ? ?  ?Nira Connardama, Talajah Slimp Eduardo, MD ?02/18/22 0155 ? ?

## 2023-02-18 ENCOUNTER — Ambulatory Visit
Admission: RE | Admit: 2023-02-18 | Discharge: 2023-02-18 | Disposition: A | Discharge status: Home / Self Care | Payer: BC Managed Care – PPO | Source: Ambulatory Visit | Attending: Family Medicine | Admitting: Family Medicine

## 2023-02-18 ENCOUNTER — Other Ambulatory Visit: Payer: Self-pay

## 2023-02-18 VITALS — BP 145/90 | HR 90 | Temp 98.5°F | Resp 20

## 2023-02-18 DIAGNOSIS — R590 Localized enlarged lymph nodes: Secondary | ICD-10-CM

## 2023-02-18 DIAGNOSIS — J029 Acute pharyngitis, unspecified: Secondary | ICD-10-CM

## 2023-02-18 NOTE — Discharge Instructions (Signed)
Try taking over the counter PEPCID twice daily for the next 1-2 weeks to see if this helps your sore throat.

## 2023-02-18 NOTE — ED Triage Notes (Signed)
Pt reports sore throat, left sided neck tenderness x2 weeks.   Pt reports initially noticed "white patches" when symptoms first started but reports did a virtual visit and was told since didn't have any fever or other symptoms was told it was something viral that could run its course.

## 2023-02-19 NOTE — ED Provider Notes (Signed)
  [facility]ARE [facility]   [id number] 02/18/23 Arrival Time: 1149  ASSESSMENT & PLAN:  1. Sore throat   2. Cervical lymphadenopathy    No signs of peritonsillar abscess. Unimpressive throat exam. Ques GERD component of symptoms. Discussed.  Trial of OTC Pepcid.    Discharge Instructions      Try taking over the counter PEPCID twice daily for the next 1-2 weeks to see if this helps your sore throat.    Watch cervical LAD. ENT f/u if not improving.  Reviewed expectations re: course of current medical issues. Questions answered. Outlined signs and symptoms indicating need for more acute intervention. Patient verbalized understanding. After Visit Summary given.   SUBJECTIVE:  Heather [name] is a 42 y.o. female who reports sore throat, left sided neck tenderness x2 weeks. Otherwise well. ST worse in the morning. Occas cough. Denies fever. Normal PO intake without n/v/d. No tx PTA.   OBJECTIVE:  Vitals:   02/18/23 1156  BP: (!) 145/90  Pulse: 90  Resp: 20  Temp: 98.5 F (36.9 C)  TempSrc: Oral  SpO2: 97%    General appearance: alert; no distress HEENT: throat with mild erythema; uvula is midline; tonsils appear normal; small L cervical LAD Neck: supple with FROM Lungs: speaks full sentences without difficulty; unlabored Abd: soft; non-tender Skin: reveals no rash; warm and dry Psychological: alert and cooperative; normal mood and affect  Allergies  Allergen Reactions   Meclizine Itching    Past Medical History:  Diagnosis Date   Anemia    Carpal tunnel syndrome, left 06/2012   Ganglion cyst 06/2012   left volar   Herpes genitalia    Postpartum care following vaginal delivery (11/6) 09/15/2016   Social History   Socioeconomic History   Marital status: Married    Spouse name: Not on file   Number of children: 2   Years of education: Not on file   Highest education level: Not on file  Occupational History   Not on file  Tobacco Use   Smoking  status: Never   Smokeless tobacco: Never  Vaping Use   Vaping Use: Never used  Substance and Sexual Activity   Alcohol use: No   Drug use: No   Sexual activity: Yes    Birth control/protection: None  Other Topics Concern   Not on file  Social History Narrative   Right handed   Lives in two story home with husband and children   Works for [employer]   Social Determinants of Corporate investment banker Strain: Not on file  Food Insecurity: Not on file  Transportation Needs: Not on file  Physical Activity: Not on file  Stress: Not on file  Social Connections: Not on file  Intimate Partner Violence: Not on file   Family History  Problem Relation Age of Onset   Hypertension Mother    Hypertension Father    Hyperlipidemia Father    Breast cancer Maternal Aunt        pt unsure   Breast cancer Maternal Uncle    Breast cancer Maternal Aunt        pt unsure           Mardella Layman, MD 02/19/23 [id number]

## 2024-12-23 ENCOUNTER — Ambulatory Visit: Admitting: Pulmonary Disease
